# Patient Record
Sex: Female | Born: 1935 | Race: Black or African American | Hispanic: No | Marital: Married | Smoking: Never smoker
Health system: Southern US, Community
[De-identification: ages and names within clinical notes are randomized; demographics above are authoritative.]

## PROBLEM LIST (undated history)

## (undated) DIAGNOSIS — M1990 Unspecified osteoarthritis, unspecified site: Secondary | ICD-10-CM

## (undated) DIAGNOSIS — I509 Heart failure, unspecified: Secondary | ICD-10-CM

## (undated) DIAGNOSIS — E119 Type 2 diabetes mellitus without complications: Secondary | ICD-10-CM

## (undated) DIAGNOSIS — I10 Essential (primary) hypertension: Secondary | ICD-10-CM

## (undated) DIAGNOSIS — I2510 Atherosclerotic heart disease of native coronary artery without angina pectoris: Secondary | ICD-10-CM

## (undated) HISTORY — PX: CORONARY ARTERY BYPASS GRAFT: SHX141

---

## 2013-06-07 ENCOUNTER — Encounter (HOSPITAL_BASED_OUTPATIENT_CLINIC_OR_DEPARTMENT_OTHER): Payer: Self-pay | Admitting: Emergency Medicine

## 2013-06-07 ENCOUNTER — Emergency Department (HOSPITAL_BASED_OUTPATIENT_CLINIC_OR_DEPARTMENT_OTHER): Payer: Medicare Other

## 2013-06-07 ENCOUNTER — Emergency Department (HOSPITAL_BASED_OUTPATIENT_CLINIC_OR_DEPARTMENT_OTHER)
Admission: EM | Admit: 2013-06-07 | Discharge: 2013-06-07 | Disposition: A | Discharge status: Home / Self Care | Payer: Medicare Other | Attending: Emergency Medicine | Admitting: Emergency Medicine

## 2013-06-07 DIAGNOSIS — E669 Obesity, unspecified: Secondary | ICD-10-CM | POA: Insufficient documentation

## 2013-06-07 DIAGNOSIS — Z79899 Other long term (current) drug therapy: Secondary | ICD-10-CM | POA: Insufficient documentation

## 2013-06-07 DIAGNOSIS — R509 Fever, unspecified: Secondary | ICD-10-CM

## 2013-06-07 DIAGNOSIS — M109 Gout, unspecified: Secondary | ICD-10-CM | POA: Insufficient documentation

## 2013-06-07 DIAGNOSIS — E876 Hypokalemia: Secondary | ICD-10-CM

## 2013-06-07 DIAGNOSIS — Z794 Long term (current) use of insulin: Secondary | ICD-10-CM | POA: Insufficient documentation

## 2013-06-07 HISTORY — DX: Type 2 diabetes mellitus without complications: E11.9

## 2013-06-07 LAB — URINALYSIS, ROUTINE W REFLEX MICROSCOPIC
Bilirubin Urine: NEGATIVE
Glucose, UA: NEGATIVE mg/dL
HGB URINE DIPSTICK: NEGATIVE
Ketones, ur: NEGATIVE mg/dL
Leukocytes, UA: NEGATIVE
Nitrite: NEGATIVE
PH: 6 (ref 5.0–8.0)
Protein, ur: NEGATIVE mg/dL
Specific Gravity, Urine: 1.011 (ref 1.005–1.030)
UROBILINOGEN UA: 1 mg/dL (ref 0.0–1.0)

## 2013-06-07 LAB — CBC WITH DIFFERENTIAL/PLATELET
Basophils Absolute: 0 10*3/uL (ref 0.0–0.1)
Basophils Relative: 0 % (ref 0–1)
Eosinophils Absolute: 0 10*3/uL (ref 0.0–0.7)
Eosinophils Relative: 0 % (ref 0–5)
HEMATOCRIT: 39.8 % (ref 36.0–46.0)
HEMOGLOBIN: 13.9 g/dL (ref 12.0–15.0)
LYMPHS ABS: 1.6 10*3/uL (ref 0.7–4.0)
LYMPHS PCT: 14 % (ref 12–46)
MCH: 31.3 pg (ref 26.0–34.0)
MCHC: 34.9 g/dL (ref 30.0–36.0)
MCV: 89.6 fL (ref 78.0–100.0)
MONO ABS: 1.7 10*3/uL — AB (ref 0.1–1.0)
MONOS PCT: 14 % — AB (ref 3–12)
NEUTROS ABS: 8.7 10*3/uL — AB (ref 1.7–7.7)
NEUTROS PCT: 72 % (ref 43–77)
Platelets: 198 10*3/uL (ref 150–400)
RBC: 4.44 MIL/uL (ref 3.87–5.11)
RDW: 13.7 % (ref 11.5–15.5)
WBC: 12.1 10*3/uL — AB (ref 4.0–10.5)

## 2013-06-07 LAB — BASIC METABOLIC PANEL
BUN: 23 mg/dL (ref 6–23)
CHLORIDE: 86 meq/L — AB (ref 96–112)
CO2: 38 mEq/L — ABNORMAL HIGH (ref 19–32)
Calcium: 10.2 mg/dL (ref 8.4–10.5)
Creatinine, Ser: 1.3 mg/dL — ABNORMAL HIGH (ref 0.50–1.10)
GFR calc non Af Amer: 38 mL/min — ABNORMAL LOW (ref >90)
GFR, EST AFRICAN AMERICAN: 44 mL/min — AB (ref >90)
GLUCOSE: 173 mg/dL — AB (ref 70–99)
Potassium: 2.4 mEq/L — CL (ref 3.7–5.3)
Sodium: 138 mEq/L (ref 137–147)

## 2013-06-07 LAB — URIC ACID: Uric Acid, Serum: 9.2 mg/dL — ABNORMAL HIGH (ref 2.4–7.0)

## 2013-06-07 LAB — I-STAT CG4 LACTIC ACID, ED: LACTIC ACID, VENOUS: 1.78 mmol/L (ref 0.5–2.2)

## 2013-06-07 LAB — POTASSIUM: POTASSIUM: 2.9 meq/L — AB (ref 3.7–5.3)

## 2013-06-07 MED ORDER — VANCOMYCIN HCL IN DEXTROSE 1-5 GM/200ML-% IV SOLN
1000.0000 mg | Freq: Once | INTRAVENOUS | Status: AC
  Administered 2013-06-07: 1000 mg via INTRAVENOUS
  Filled 2013-06-07: qty 200

## 2013-06-07 MED ORDER — POTASSIUM CHLORIDE 10 MEQ/100ML IV SOLN
10.0000 meq | INTRAVENOUS | Status: AC
Start: 2013-06-07 — End: 2013-06-07
  Administered 2013-06-07 (×3): 10 meq via INTRAVENOUS
  Filled 2013-06-07 (×3): qty 100

## 2013-06-07 MED ORDER — POTASSIUM CHLORIDE CRYS ER 20 MEQ PO TBCR
40.0000 meq | EXTENDED_RELEASE_TABLET | Freq: Once | ORAL | Status: AC
  Administered 2013-06-07: 40 meq via ORAL
  Filled 2013-06-07: qty 2

## 2013-06-07 MED ORDER — IBUPROFEN 800 MG PO TABS
800.0000 mg | ORAL_TABLET | Freq: Once | ORAL | Status: AC
  Administered 2013-06-07: 800 mg via ORAL
  Filled 2013-06-07: qty 1

## 2013-06-07 MED ORDER — PIPERACILLIN-TAZOBACTAM 3.375 G IVPB
3.3750 g | Freq: Once | INTRAVENOUS | Status: AC
  Administered 2013-06-07: 3.375 g via INTRAVENOUS
  Filled 2013-06-07: qty 50

## 2013-06-07 MED ORDER — POTASSIUM CHLORIDE CRYS ER 20 MEQ PO TBCR: 40.0000 meq | EXTENDED_RELEASE_TABLET | Freq: Two times a day (BID) | ORAL | Status: DC

## 2013-06-07 MED ORDER — SODIUM CHLORIDE 0.9 % IV SOLN
INTRAVENOUS | Status: DC
  Administered 2013-06-07: 12:00:00 via INTRAVENOUS

## 2013-06-07 MED ORDER — OXYCODONE-ACETAMINOPHEN 5-325 MG PO TABS
2.0000 | ORAL_TABLET | Freq: Once | ORAL | Status: AC
  Administered 2013-06-07: 2 via ORAL
  Filled 2013-06-07: qty 2

## 2013-06-07 NOTE — ED Notes (Signed)
Pt c/o "burning" to iv site and right arm. No swelling or redness noted. Iv slowed to 50cc/hr, pt reports relief of burning sensation.

## 2013-06-07 NOTE — ED Provider Notes (Signed)
Patient was seen by Dr. Rosalia Hammers. She was seen for a gouty arthritis flare were hand. She was noted to be hypokalemic. She was awaiting a repeat potassium. The plan was if the potassium is 2.9 or higher, the patient was able to be discharged with outpatient followup. The potassium was indeed improved to 2.9. She was discharged with prescriptions that were printed per Dr. Rosalia Hammers.  Rolan Bucco, MD 06/07/13 2132

## 2013-06-07 NOTE — ED Provider Notes (Signed)
CSN: [id number]     Arrival date & time 06/07/13  1021 History   First MD Initiated Contact with Patient 06/07/13 1035     Chief Complaint  Patient presents with  . Hand Pain  dementia Level 5 caveat   (Consider location/radiation/quality/duration/timing/severity/associated sxs/prior Treatment) HPI Dementia- history obtained from patient and daughter 80 y.o. Female with history of gout complaining of pain and swelling in her left hand c.w. Prior gout flares.  States last occurred about a month ago in same place.  She has had multiple episode of gout treated by her md in feet, knees, and hands. Neither she or her daughter are able to tell me what she has been treated with for her gout.  Her doctor is not local.  She is her staying with her daughter while her husband is hospitalized as she id unable to stay alone due to blindness and dementia.  No past medical history on file. No past surgical history on file. No family history on file. History  Substance Use Topics  . Smoking status: Not on file  . Smokeless tobacco: Not on file  . Alcohol Use: Not on file   OB History   No data available     Review of Systems  All other systems reviewed and are negative.     Allergies  Review of patient's allergies indicates no known allergies.  Home Medications   Prior to Admission medications   Medication Sig Start Date End Date Taking? Authorizing Provider  ALPRAZolam Prudy Feeler(XANAX) 0.25 MG tablet Take 0.25 mg by mouth at bedtime as needed for anxiety.   Yes Historical Provider, MD  amLODipine (NORVASC) 5 MG tablet Take 5 mg by mouth daily.   Yes Historical Provider, MD  atorvastatin (LIPITOR) 20 MG tablet Take 20 mg by mouth daily.   Yes Historical Provider, MD  carvedilol (COREG) 3.125 MG tablet Take 3.125 mg by mouth 2 (two) times daily with a meal.   Yes Historical Provider, MD  citalopram (CELEXA) 20 MG tablet Take 20 mg by mouth daily.   Yes Historical Provider, MD  donepezil (ARICEPT)  10 MG tablet Take 10 mg by mouth at bedtime.   Yes Historical Provider, MD  esomeprazole (NEXIUM) 40 MG capsule Take 40 mg by mouth daily at 12 noon.   Yes Historical Provider, MD  furosemide (LASIX) 80 MG tablet Take 80 mg by mouth 2 (two) times daily.   Yes Historical Provider, MD  gabapentin (NEURONTIN) 300 MG capsule Take 300 mg by mouth 2 (two) times daily.   Yes Historical Provider, MD  insulin NPH-regular Human (NOVOLIN 70/30) (70-30) 100 UNIT/ML injection Inject into the skin. 70 units q am, 40 units qpm   Yes Historical Provider, MD  isosorbide mononitrate (IMDUR) 30 MG 24 hr tablet Take 60 mg by mouth daily.   Yes Historical Provider, MD  metolazone (ZAROXOLYN) 2.5 MG tablet Take 2.5 mg by mouth daily.   Yes Historical Provider, MD  potassium chloride SA (K-DUR,KLOR-CON) 20 MEQ tablet Take 20 mEq by mouth 2 (two) times daily.   Yes Historical Provider, MD  sucralfate (CARAFATE) 1 G tablet Take 1 g by mouth 4 (four) times daily -  with meals and at bedtime.   Yes Historical Provider, MD   BP 154/91  Pulse 58  Temp(Src) 100.4 F (38 C) (Oral)  Resp 18  SpO2 96% Physical Exam  Nursing note and vitals reviewed. Constitutional: She appears well-developed and well-nourished.  obese  HENT:  Head: Normocephalic.  Right Ear: External ear normal.  Left Ear: External ear normal.  Nose: Nose normal.  Mouth/Throat: Oropharynx is clear and moist.  Eyes: Conjunctivae and EOM are normal. Pupils are equal, round, and reactive to light.  Neck: Normal range of motion. Neck supple.  Cardiovascular: Normal rate, regular rhythm, normal heart sounds and intact distal pulses.   Pulmonary/Chest: Effort normal and breath sounds normal.  Abdominal: Soft. Bowel sounds are normal.  Musculoskeletal:       Hands:   ED Course  Procedures (including critical care time) Labs Review Labs Reviewed  CBC WITH DIFFERENTIAL  BASIC METABOLIC PANEL  URIC ACID    Imaging Review No results found.   EKG  Interpretation None      MDM   Final diagnoses:  Gouty arthritis  Fever  Hypokalemia    Patient presents with complaints of gouty arthritis but incidental finding of fever and hypokalemia.  1- gouty arthritis 2- fever- unclear source- blood cultures obtained and patient covered empirically for unknown source although cannot rule out hand and wrist as source.  3-hypokalemia with ekg with nonspecific intraventricular block and nsst change of unknown chronicity which are consistent with hypokalemia.  Repletion ensuing. 4- abnormal ekg   Discussed with Dr. Waymon Amato.  We will recheck potassium and if potassium improved, we will discharge patient to follow up with pmd next week.   Discussed with Dr. Fredderick Phenix and plan discussed.   Hilario Quarry, MD 06/09/13 [phone]

## 2013-06-07 NOTE — Discharge Instructions (Signed)
Gout Gout is when your joints become red, sore, and swell (inflammed). This is caused by the buildup of uric acid crystals in the joints. Uric acid is a chemical that is normally in the blood. If the level of uric acid gets too high in the blood, these crystals form in your joints and tissues. Over time, these crystals can form into masses near the joints and tissues. These masses can destroy bone and cause the bone to look misshapen (deformed). HOME CARE   Do not take aspirin for pain.  Only take medicine as told by your doctor.  Rest the joint as much as you can. When in bed, keep sheets and blankets off painful areas.  Keep the sore joints raised (elevated).  Put warm or cold packs on painful joints. Use of warm or cold packs depends on which works best for you.  Use crutches if the painful joint is in your leg.  Drink enough fluids to keep your pee (urine) clear or pale yellow. Limit alcohol, sugary drinks, and drinks with fructose in them.  Follow your diet instructions. Pay careful attention to how much protein you eat. Include fruits, vegetables, whole grains, and fat-free or low-fat milk products in your daily diet. Talk to your doctor or dietician about the use of coffee, vitamin C, and cherries. These may help lower uric acid levels.  Keep a healthy body weight. GET HELP RIGHT AWAY IF:   You have watery poop (diarrhea), throw up (vomit), or have any side effects from medicines.  You do not feel better in 24 hours, or you are getting worse.  Your joint becomes suddenly more tender, and you have chills or a fever. MAKE SURE YOU:   Understand these instructions.  Will watch your condition.  Will get help right away if you are not doing well or get worse. Document Released: 10/13/2007 Document Revised: 04/30/2012 Document Reviewed: 04/13/2009 [facility] Patient Information 2014 [place], [place]. Hypokalemia Hypokalemia means that the amount of potassium in the blood is lower  than normal.Potassium is a chemical, called an electrolyte, that helps regulate the amount of fluid in the body. It also stimulates muscle contraction and helps nerves function properly.Most of the body's potassium is inside of cells, and only a very small amount is in the blood. Because the amount in the blood is so small, minor changes can be life-threatening. CAUSES  Antibiotics.  Diarrhea or vomiting.  Using laxatives too much, which can cause diarrhea.  Chronic kidney disease.  Water pills (diuretics).  Eating disorders (bulimia).  Low magnesium level.  Sweating a lot. SIGNS AND SYMPTOMS  Weakness.  Constipation.  Fatigue.  Muscle cramps.  Mental confusion.  Skipped heartbeats or irregular heartbeat (palpitations).  Tingling or numbness. DIAGNOSIS  Your health care provider can diagnose hypokalemia with blood tests. In addition to checking your potassium level, your health care provider may also check other lab tests. TREATMENT Hypokalemia can be treated with potassium supplements taken by mouth or adjustments in your current medicines. If your potassium level is very low, you may need to get potassium through a vein (IV) and be monitored in the hospital. A diet high in potassium is also helpful. Foods high in potassium are:  Nuts, such as peanuts and pistachios.  Seeds, such as sunflower seeds and pumpkin seeds.  Peas, lentils, and lima beans.  Whole grain and bran cereals and breads.  Fresh fruit and vegetables, such as apricots, avocado, bananas, cantaloupe, kiwi, oranges, tomatoes, asparagus, and potatoes.  Orange  and tomato juices.  Red meats.  Fruit yogurt. HOME CARE INSTRUCTIONS  Take all medicines as prescribed by your health care provider.  Maintain a healthy diet by including nutritious food, such as fruits, vegetables, nuts, whole grains, and lean meats.  If you are taking a laxative, be sure to follow the directions on the label. SEEK  MEDICAL CARE IF:  Your weakness gets worse.  You feel your heart pounding or racing.  You are vomiting or having diarrhea.  You are diabetic and having trouble keeping your blood glucose in the normal range. SEEK IMMEDIATE MEDICAL CARE IF:  You have chest pain, shortness of breath, or dizziness.  You are vomiting or having diarrhea for more than 2 days.  You faint. MAKE SURE YOU:   Understand these instructions.  Will watch your condition.  Will get help right away if you are not doing well or get worse. Document Released: 01/03/2005 Document Revised: 10/24/2012 Document Reviewed: 07/06/2012 [facility] Patient Information 2014 [place], [place].

## 2013-06-07 NOTE — ED Notes (Signed)
Pt to room 6 in w/c, able to stand and walk to bed using cane. Per daughter, pt started having left hand swelling, redness, pain and stiffness overnight, worsening. Pt states this is her usual gout pain, "I know what it is, I've had it so many times before.Marland KitchenMarland Kitchen"

## 2013-06-13 LAB — CULTURE, BLOOD (ROUTINE X 2)
CULTURE: NO GROWTH
Culture: NO GROWTH

## 2013-08-18 ENCOUNTER — Emergency Department (HOSPITAL_BASED_OUTPATIENT_CLINIC_OR_DEPARTMENT_OTHER): Payer: Medicare Other

## 2013-08-18 ENCOUNTER — Encounter (HOSPITAL_BASED_OUTPATIENT_CLINIC_OR_DEPARTMENT_OTHER): Payer: Self-pay | Admitting: Emergency Medicine

## 2013-08-18 ENCOUNTER — Inpatient Hospital Stay (HOSPITAL_BASED_OUTPATIENT_CLINIC_OR_DEPARTMENT_OTHER)
Admission: EM | Admit: 2013-08-18 | Discharge: 2013-08-22 | DRG: 683 | Disposition: A | Discharge status: Home / Self Care | Payer: Medicare Other | Attending: Internal Medicine | Admitting: Internal Medicine

## 2013-08-18 DIAGNOSIS — E785 Hyperlipidemia, unspecified: Secondary | ICD-10-CM | POA: Diagnosis present

## 2013-08-18 DIAGNOSIS — I25119 Atherosclerotic heart disease of native coronary artery with unspecified angina pectoris: Secondary | ICD-10-CM

## 2013-08-18 DIAGNOSIS — I2510 Atherosclerotic heart disease of native coronary artery without angina pectoris: Secondary | ICD-10-CM | POA: Diagnosis present

## 2013-08-18 DIAGNOSIS — H409 Unspecified glaucoma: Secondary | ICD-10-CM | POA: Diagnosis present

## 2013-08-18 DIAGNOSIS — I4729 Other ventricular tachycardia: Secondary | ICD-10-CM | POA: Diagnosis not present

## 2013-08-18 DIAGNOSIS — N183 Chronic kidney disease, stage 3 unspecified: Secondary | ICD-10-CM | POA: Diagnosis present

## 2013-08-18 DIAGNOSIS — D649 Anemia, unspecified: Secondary | ICD-10-CM | POA: Diagnosis present

## 2013-08-18 DIAGNOSIS — M109 Gout, unspecified: Secondary | ICD-10-CM | POA: Diagnosis present

## 2013-08-18 DIAGNOSIS — R9431 Abnormal electrocardiogram [ECG] [EKG]: Secondary | ICD-10-CM | POA: Diagnosis present

## 2013-08-18 DIAGNOSIS — F0390 Unspecified dementia without behavioral disturbance: Secondary | ICD-10-CM | POA: Diagnosis present

## 2013-08-18 DIAGNOSIS — E860 Dehydration: Secondary | ICD-10-CM | POA: Diagnosis present

## 2013-08-18 DIAGNOSIS — E876 Hypokalemia: Secondary | ICD-10-CM | POA: Diagnosis present

## 2013-08-18 DIAGNOSIS — N19 Unspecified kidney failure: Secondary | ICD-10-CM

## 2013-08-18 DIAGNOSIS — I10 Essential (primary) hypertension: Secondary | ICD-10-CM | POA: Diagnosis present

## 2013-08-18 DIAGNOSIS — R079 Chest pain, unspecified: Secondary | ICD-10-CM | POA: Diagnosis not present

## 2013-08-18 DIAGNOSIS — R404 Transient alteration of awareness: Secondary | ICD-10-CM | POA: Diagnosis present

## 2013-08-18 DIAGNOSIS — Z794 Long term (current) use of insulin: Secondary | ICD-10-CM | POA: Diagnosis not present

## 2013-08-18 DIAGNOSIS — I959 Hypotension, unspecified: Secondary | ICD-10-CM | POA: Diagnosis present

## 2013-08-18 DIAGNOSIS — I472 Ventricular tachycardia, unspecified: Secondary | ICD-10-CM | POA: Diagnosis not present

## 2013-08-18 DIAGNOSIS — N179 Acute kidney failure, unspecified: Secondary | ICD-10-CM | POA: Diagnosis present

## 2013-08-18 DIAGNOSIS — N170 Acute kidney failure with tubular necrosis: Secondary | ICD-10-CM | POA: Diagnosis not present

## 2013-08-18 DIAGNOSIS — I509 Heart failure, unspecified: Secondary | ICD-10-CM | POA: Diagnosis present

## 2013-08-18 DIAGNOSIS — Z951 Presence of aortocoronary bypass graft: Secondary | ICD-10-CM | POA: Diagnosis not present

## 2013-08-18 DIAGNOSIS — E1129 Type 2 diabetes mellitus with other diabetic kidney complication: Secondary | ICD-10-CM | POA: Diagnosis present

## 2013-08-18 DIAGNOSIS — I129 Hypertensive chronic kidney disease with stage 1 through stage 4 chronic kidney disease, or unspecified chronic kidney disease: Secondary | ICD-10-CM | POA: Diagnosis present

## 2013-08-18 DIAGNOSIS — G4733 Obstructive sleep apnea (adult) (pediatric): Secondary | ICD-10-CM | POA: Diagnosis present

## 2013-08-18 DIAGNOSIS — H547 Unspecified visual loss: Secondary | ICD-10-CM | POA: Diagnosis present

## 2013-08-18 DIAGNOSIS — N058 Unspecified nephritic syndrome with other morphologic changes: Secondary | ICD-10-CM | POA: Diagnosis present

## 2013-08-18 DIAGNOSIS — I5032 Chronic diastolic (congestive) heart failure: Secondary | ICD-10-CM | POA: Diagnosis present

## 2013-08-18 DIAGNOSIS — E118 Type 2 diabetes mellitus with unspecified complications: Secondary | ICD-10-CM

## 2013-08-18 DIAGNOSIS — H543 Unqualified visual loss, both eyes: Secondary | ICD-10-CM | POA: Diagnosis present

## 2013-08-18 DIAGNOSIS — M129 Arthropathy, unspecified: Secondary | ICD-10-CM | POA: Diagnosis present

## 2013-08-18 DIAGNOSIS — R55 Syncope and collapse: Secondary | ICD-10-CM

## 2013-08-18 DIAGNOSIS — N189 Chronic kidney disease, unspecified: Secondary | ICD-10-CM

## 2013-08-18 DIAGNOSIS — Z9989 Dependence on other enabling machines and devices: Secondary | ICD-10-CM

## 2013-08-18 DIAGNOSIS — E119 Type 2 diabetes mellitus without complications: Secondary | ICD-10-CM | POA: Diagnosis present

## 2013-08-18 DIAGNOSIS — R42 Dizziness and giddiness: Secondary | ICD-10-CM | POA: Diagnosis present

## 2013-08-18 DIAGNOSIS — W19XXXA Unspecified fall, initial encounter: Secondary | ICD-10-CM

## 2013-08-18 HISTORY — DX: Essential (primary) hypertension: I10

## 2013-08-18 HISTORY — DX: Unspecified osteoarthritis, unspecified site: M19.90

## 2013-08-18 HISTORY — DX: Atherosclerotic heart disease of native coronary artery without angina pectoris: I25.10

## 2013-08-18 LAB — CBC WITH DIFFERENTIAL/PLATELET
Basophils Absolute: 0 10*3/uL (ref 0.0–0.1)
Basophils Relative: 0 % (ref 0–1)
EOS ABS: 0.1 10*3/uL (ref 0.0–0.7)
Eosinophils Relative: 1 % (ref 0–5)
HCT: 35.7 % — ABNORMAL LOW (ref 36.0–46.0)
HEMOGLOBIN: 12.1 g/dL (ref 12.0–15.0)
LYMPHS ABS: 2.2 10*3/uL (ref 0.7–4.0)
Lymphocytes Relative: 30 % (ref 12–46)
MCH: 30.2 pg (ref 26.0–34.0)
MCHC: 33.9 g/dL (ref 30.0–36.0)
MCV: 89 fL (ref 78.0–100.0)
Monocytes Absolute: 0.9 10*3/uL (ref 0.1–1.0)
Monocytes Relative: 12 % (ref 3–12)
NEUTROS ABS: 4.2 10*3/uL (ref 1.7–7.7)
NEUTROS PCT: 57 % (ref 43–77)
Platelets: ADEQUATE 10*3/uL (ref 150–400)
RBC: 4.01 MIL/uL (ref 3.87–5.11)
RDW: 13.4 % (ref 11.5–15.5)
WBC: 7.3 10*3/uL (ref 4.0–10.5)

## 2013-08-18 LAB — COMPREHENSIVE METABOLIC PANEL
ALT: 6 U/L (ref 0–35)
ANION GAP: 24 — AB (ref 5–15)
AST: 13 U/L (ref 0–37)
Albumin: 3.2 g/dL — ABNORMAL LOW (ref 3.5–5.2)
Alkaline Phosphatase: 66 U/L (ref 39–117)
BUN: 76 mg/dL — AB (ref 6–23)
CO2: 24 meq/L (ref 19–32)
Calcium: 9.1 mg/dL (ref 8.4–10.5)
Chloride: 89 mEq/L — ABNORMAL LOW (ref 96–112)
Creatinine, Ser: 5.7 mg/dL — ABNORMAL HIGH (ref 0.50–1.10)
GFR calc Af Amer: 7 mL/min — ABNORMAL LOW (ref >90)
GFR calc non Af Amer: 6 mL/min — ABNORMAL LOW (ref >90)
Glucose, Bld: 211 mg/dL — ABNORMAL HIGH (ref 70–99)
Potassium: 3.5 mEq/L — ABNORMAL LOW (ref 3.7–5.3)
Sodium: 137 mEq/L (ref 137–147)
Total Protein: 6.9 g/dL (ref 6.0–8.3)

## 2013-08-18 LAB — GLUCOSE, CAPILLARY
Glucose-Capillary: 78 mg/dL (ref 70–99)
Glucose-Capillary: 153 mg/dL — ABNORMAL HIGH (ref 70–99)

## 2013-08-18 LAB — APTT: aPTT: 29 seconds (ref 24–37)

## 2013-08-18 LAB — I-STAT CG4 LACTIC ACID, ED: Lactic Acid, Venous: 1.89 mmol/L (ref 0.5–2.2)

## 2013-08-18 LAB — MAGNESIUM: Magnesium: 2.1 mg/dL (ref 1.5–2.5)

## 2013-08-18 LAB — PROTIME-INR
INR: 1.03 (ref 0.00–1.49)
PROTHROMBIN TIME: 13.5 s (ref 11.6–15.2)

## 2013-08-18 LAB — TROPONIN I

## 2013-08-18 LAB — CBG MONITORING, ED: GLUCOSE-CAPILLARY: 207 mg/dL — AB (ref 70–99)

## 2013-08-18 MED ORDER — ATORVASTATIN CALCIUM 20 MG PO TABS
20.0000 mg | ORAL_TABLET | Freq: Every day | ORAL | Status: DC
  Administered 2013-08-19 – 2013-08-22 (×4): 20 mg via ORAL
  Filled 2013-08-18 (×4): qty 1

## 2013-08-18 MED ORDER — CITALOPRAM HYDROBROMIDE 20 MG PO TABS
20.0000 mg | ORAL_TABLET | Freq: Every day | ORAL | Status: DC
  Administered 2013-08-19 – 2013-08-22 (×4): 20 mg via ORAL
  Filled 2013-08-18 (×4): qty 1

## 2013-08-18 MED ORDER — INSULIN ASPART 100 UNIT/ML ~~LOC~~ SOLN
0.0000 [IU] | Freq: Three times a day (TID) | SUBCUTANEOUS | Status: DC
Start: 2013-08-19 — End: 2013-08-18

## 2013-08-18 MED ORDER — ALPRAZOLAM 0.25 MG PO TABS
0.2500 mg | ORAL_TABLET | Freq: Every evening | ORAL | Status: DC | PRN
  Administered 2013-08-20: 0.25 mg via ORAL
  Filled 2013-08-18 (×2): qty 1

## 2013-08-18 MED ORDER — SODIUM CHLORIDE 0.9 % IV BOLUS (SEPSIS): 1000.0000 mL | INTRAVENOUS | Status: AC

## 2013-08-18 MED ORDER — INSULIN ASPART 100 UNIT/ML ~~LOC~~ SOLN: 0.0000 [IU] | Freq: Three times a day (TID) | SUBCUTANEOUS | Status: DC

## 2013-08-18 MED ORDER — DONEPEZIL HCL 10 MG PO TABS
10.0000 mg | ORAL_TABLET | Freq: Every day | ORAL | Status: DC
  Administered 2013-08-18 – 2013-08-21 (×4): 10 mg via ORAL
  Filled 2013-08-18 (×5): qty 1

## 2013-08-18 MED ORDER — PANTOPRAZOLE SODIUM 40 MG PO TBEC
40.0000 mg | DELAYED_RELEASE_TABLET | Freq: Every day | ORAL | Status: DC
  Administered 2013-08-19 – 2013-08-22 (×4): 40 mg via ORAL
  Filled 2013-08-18 (×3): qty 1

## 2013-08-18 MED ORDER — HEPARIN SODIUM (PORCINE) 5000 UNIT/ML IJ SOLN
5000.0000 [IU] | Freq: Three times a day (TID) | INTRAMUSCULAR | Status: DC
  Administered 2013-08-18 – 2013-08-22 (×10): 5000 [IU] via SUBCUTANEOUS
  Filled 2013-08-18 (×16): qty 1

## 2013-08-18 MED ORDER — POTASSIUM CHLORIDE CRYS ER 20 MEQ PO TBCR
20.0000 meq | EXTENDED_RELEASE_TABLET | Freq: Once | ORAL | Status: AC
  Administered 2013-08-18: 20 meq via ORAL
  Filled 2013-08-18: qty 1

## 2013-08-18 MED ORDER — SODIUM CHLORIDE 0.9 % IJ SOLN
3.0000 mL | Freq: Two times a day (BID) | INTRAMUSCULAR | Status: DC
  Administered 2013-08-20 – 2013-08-21 (×3): 3 mL via INTRAVENOUS

## 2013-08-18 MED ORDER — SODIUM CHLORIDE 0.9 % IV SOLN
INTRAVENOUS | Status: DC
  Administered 2013-08-18 – 2013-08-19 (×2): via INTRAVENOUS

## 2013-08-18 MED ORDER — SUCRALFATE 1 G PO TABS
1.0000 g | ORAL_TABLET | Freq: Three times a day (TID) | ORAL | Status: DC
  Administered 2013-08-18 – 2013-08-22 (×15): 1 g via ORAL
  Filled 2013-08-18 (×19): qty 1

## 2013-08-18 NOTE — ED Notes (Signed)
Patient arrived by POV with family who report acute dizziness since awakening this am and found to be hypotensive on arrival. Patient also fell this am out of chair while eating at restaurant. Small hematoma to forehead, family denies loc. Patient alert and answers questions appropriately. Patient daughter reports that patient been experiencing some memory problems and incontinence issues. Hx of diabetes, patient denies chest pain but reports some associated shortness of breath

## 2013-08-18 NOTE — H&P (Addendum)
History and Physical    Christy Swanson RUE:[id number] DOB: [birth year] DOA: 08/18/2013  Referring physician: Dr. Romeo Apple PCP: No primary provider on file.  Specialists: none   Chief Complaint: Weakness and dizziness  HPI: Christy Swanson is a 80 y.o. female a relatively new in our system but tells me he has a history of diabetes mellitus, hypertension, coronary artery disease status post CABG about 10 years ago, presents to the emergency room with a chief complaint of dizziness,weakness and lightheadedness for the past 12-24 hours. The history is provided by the patient. She was feeling fine yesterday morning, however last night she started feeling a little bit weak, and has felt progressively worse today and noticed that the dizziness is significantly worse when she stands up, to the point were she had a fall this morning. She denies any syncopal episodes but apparently hit her head. She denies any fever or chills. She denies any shortness or breath or chest congestion, she has a mild cough for 2 days. She denies any chest pain. She has no abdominal pain, she has mild nausea but no vomiting or diarrhea. She denies any dysuria or burning with urination, however noticed that she has been incontinent on one occasion. In the emergency room, patient was found to be in acute renal failure with a creatinine of 5.7 from a baseline of 1.3 about 3 months ago and on her emergency room visit. Given his to a fall she underwent a CT scan of the head without contrast which showed no acute pathology. She had a chest x-ray which showed no evidence of pneumonia. A urinalysis is pending.  Review of Systems: As per history of present illness, otherwise negative  Past Medical History  Diagnosis Date  . Diabetes mellitus without complication   . Hypertension   . Coronary artery disease   . Arthritis    Past Surgical History  Procedure Laterality Date  . Coronary artery bypass graft     Social History:  reports  that she has never smoked. She does not have any smokeless tobacco history on file. She reports that she does not drink alcohol. Her drug history is not on file.  No Known Allergies  Family history noncontributory  Prior to Admission medications   Medication Sig Start Date End Date Taking? Authorizing Provider  ALPRAZolam Prudy Feeler) 0.25 MG tablet Take 0.25 mg by mouth at bedtime as needed for anxiety.    Historical Provider, MD  amLODipine (NORVASC) 5 MG tablet Take 5 mg by mouth daily.    Historical Provider, MD  atorvastatin (LIPITOR) 20 MG tablet Take 20 mg by mouth daily.    Historical Provider, MD  carvedilol (COREG) 3.125 MG tablet Take 3.125 mg by mouth 2 (two) times daily with a meal.    Historical Provider, MD  citalopram (CELEXA) 20 MG tablet Take 20 mg by mouth daily.    Historical Provider, MD  donepezil (ARICEPT) 10 MG tablet Take 10 mg by mouth at bedtime.    Historical Provider, MD  esomeprazole (NEXIUM) 40 MG capsule Take 40 mg by mouth daily at 12 noon.    Historical Provider, MD  furosemide (LASIX) 80 MG tablet Take 80 mg by mouth 2 (two) times daily.    Historical Provider, MD  gabapentin (NEURONTIN) 300 MG capsule Take 300 mg by mouth 2 (two) times daily.    Historical Provider, MD  insulin NPH-regular Human (NOVOLIN 70/30) (70-30) 100 UNIT/ML injection Inject into the skin. 70 units q am, 40 units qpm  Historical Provider, MD  isosorbide mononitrate (IMDUR) 30 MG 24 hr tablet Take 60 mg by mouth daily.    Historical Provider, MD  metolazone (ZAROXOLYN) 2.5 MG tablet Take 2.5 mg by mouth daily.    Historical Provider, MD  potassium chloride SA (K-DUR,KLOR-CON) 20 MEQ tablet Take 20 mEq by mouth 2 (two) times daily.    Historical Provider, MD  potassium chloride SA (K-DUR,KLOR-CON) 20 MEQ tablet Take 2 tablets (40 mEq total) by mouth 2 (two) times daily. 06/07/13   Hilario Quarry, MD  sucralfate (CARAFATE) 1 G tablet Take 1 g by mouth 4 (four) times daily -  with meals and at  bedtime.    Historical Provider, MD   Physical Exam: Filed Vitals:   08/18/13 1530 08/18/13 1600 08/18/13 1721 08/18/13 1723  BP: 104/45 107/47 67/31 74/36   Pulse: 53 48  52  Temp:    98 F (36.7 C)  TempSrc:    Oral  Resp: 19 18  18   Height:    5\' 4"  (1.626 m)  Weight:    99.1 kg (218 lb 7.6 oz)  SpO2: 97% 95%  94%     General:  No apparent distress   Eyes: prosthetic L eye, opaque right eye   Neck: no JVD  Cardiovascular: regular rate without MRG; 2+ peripheral pulses  Respiratory: Good air movement bilaterally, no wheezing, mild rhonchi throughout   Abdomen: soft, non tender to palpation, positive bowel sounds, no guarding, no rebound  Skin: no rashes  Musculoskeletal: no peripheral edema  Neurologic: Nonfocal findings, strength 5 out of 5 all 4 extremities  Labs on Admission:  Basic Metabolic Panel:  Recent Labs Lab 08/18/13 1244 08/18/13 1322  NA 137  --   K 3.5*  --   CL 89*  --   CO2 24  --   GLUCOSE 211*  --   BUN 76*  --   CREATININE 5.70*  --   CALCIUM 9.1  --   MG  --  2.1   Liver Function Tests:  Recent Labs Lab 08/18/13 1244  AST 13  ALT 6  ALKPHOS 66  BILITOT <0.2*  PROT 6.9  ALBUMIN 3.2*   CBC:  Recent Labs Lab 08/18/13 1244  WBC 7.3  NEUTROABS 4.2  HGB 12.1  HCT 35.7*  MCV 89.0  PLT PLATELET CLUMPS NOTED ON SMEAR, COUNT APPEARS ADEQUATE   Cardiac Enzymes:  Recent Labs Lab 08/18/13 1322  TROPONINI <0.30    BNP (last 3 results) No results found for this basename: PROBNP,  in the last 8760 hours CBG:  Recent Labs Lab 08/18/13 1118 08/18/13 1720  GLUCAP 207* 78    Radiological Exams on Admission: Dg Chest 2 View  08/18/2013   CLINICAL DATA:  Dizziness, hypotension, fall today  EXAM: CHEST  2 VIEW  COMPARISON:  06/07/2013  FINDINGS: Linear right lower lobe and left mid lung zone scarring are stable. Evidence of CABG noted with stable rightward position of the inferior most 2 sternal wires as compared to the  superior 5 sternal wires. Heart size is mildly enlarged without evidence for edema. No pleural effusion. No acute osseous finding.  IMPRESSION: Mild cardiomegaly with bibasilar scarring but no acute finding.   Electronically Signed   By: Christiana Pellant M.D.   On: 08/18/2013 12:57   Ct Head Wo Contrast  08/18/2013   ADDENDUM REPORT: 08/18/2013 14:14   Electronically Signed   By: Maryclare Bean M.D.   On: 08/18/2013 14:14   08/18/2013  CLINICAL DATA:  Dizziness  EXAM: CT HEAD WITHOUT CONTRAST  TECHNIQUE: Contiguous axial images were obtained from the base of the skull through the vertex without intravenous contrast.  COMPARISON:  None.  FINDINGS: Global atrophy. Chronic ischemic changes in the periventricular white matter. No mass effect, midline shift, or acute hemorrhage. Inflammatory changes in the sphenoid sinuses. Mastoid air cells are clear.  IMPRESSION: No acute intracranial pathology.  Electronically Signed: By: Maryclare BeanArt  Hoss M.D. On: 08/18/2013 13:43    EKG: Independently reviewed. Sinus bradycardia.   Assessment/Plan Principal Problem:   Renal failure Active Problems:   CAD (coronary artery disease)   S/P CABG (coronary artery bypass graft)   Acute renal failure   Dehydration   Hypotension, unspecified   Diabetes mellitus   Glaucoma   Blindness   Essential hypertension, benign   Other and unspecified hyperlipidemia     Hypotension - with symptomatic dizziness and symptomatic orthostasis, with renal failure and she clinically seems to be very dehydrated. She is on furosemide and metolazone and she tells me that she took these medications this morning as well as all her other medications for blood pressure. On arrival to 6E she is hypotensive to 70s systolic, asymptomatic while lying flat.  - Start bolus IV fluids - No apparent infectious source, urinalysis pending at this time, she is afebrile and has no leukocytosis, no need for antibiotics unless urinalysis is revealing. Lactic acid is  normal.   Acute renal failure - I not that clear about baseline creatinine, however 3 months ago her creatinine was 1.3. This is likely due to dehydration, repeat basic metabolic panel in the morning after IV fluids, consider nephrology consult if it fails to improve.  Hypertension - hold home antihypertensives  Diabetes mellitus - obtain hemoglobin A1c to assess control. Her blood sugar presentation a 78, will hold home insulin of 70/30 given renal failure, allow a carb diet and use moderate sliding scale insulin. Start long-acting insulin if needed.  Coronary artery disease - she had a CABG about 10 years ago - Stable currently, no chest pain - Resume the atorvastatin, hold carvedilol, hold Lasix, hold Imdur, hold metolazone  Hyperlipidemia - on atorvastatin  Hypokalemia - she is on significant potassium supplementation at home probably because she is on Lasix, and even with renal failure she is hypokalemic on admission, previous potassium levels in May of 2.4, will replete 20 mEq tonight and repeat BMP in am.    Diet: Carb modified Fluids: NS 125 cc/h after bolus DVT Prophylaxis: heparin  Code Status: Full  Family Communication: none  Disposition Plan: inpatient  Time spent: 5870  This note has been created with Education officer, environmentalDragon speech recognition software and smart phrase technology. Any transcriptional errors are unintentional.   Christy Crandle M. Elvera LennoxGherghe, MD [facility] Pager [phone]  If 7PM-7AM, please contact night-coverage [email or web address] Password [email or web address] 08/18/2013, 5:40 PM

## 2013-08-18 NOTE — Progress Notes (Signed)
[facility] ([facility] AppleHarrison[facility] of Ramond DialDorothy Buschman (MRN [id number]) Patient came to ED complaining of dizziness and episode of near syncope; was found to be hypotensive, mildly dehydrated and with blood work  Demonstrating acute on chronic renal failure (Cr 1.3 in May 2015 and is 5.70 today 08/18/13). [facility]; after initial work up and treatment might required renal service consultation. Of note, CT head neg, K3.5 and after initial IVF's vital signs stable (BP just soft into low 100's)  Vassie LollMadera, Dmani Mizer [phone]

## 2013-08-18 NOTE — ED Notes (Signed)
Patient transported to X-ray 

## 2013-08-18 NOTE — ED Provider Notes (Signed)
CSN: [id number]     Arrival date & time 08/18/13  1059 History   First MD Initiated Contact with Patient 08/18/13 1221     Chief Complaint  Patient presents with  . Dizziness     (Consider location/radiation/quality/duration/timing/severity/associated sxs/prior Treatment) Patient is a 80 y.o. female presenting with dizziness and fall. The history is provided by the patient and a caregiver.  Dizziness Quality:  Room spinning Severity:  Mild Onset quality:  Gradual Duration:  1 day Timing:  Constant Progression:  Worsening Chronicity:  New Context comment:  With standing Relieved by:  Nothing Worsened by:  Nothing tried Ineffective treatments:  None tried Associated symptoms: no chest pain, no diarrhea, no headaches, no nausea, no shortness of breath and no vomiting   Fall This is a new problem. The current episode started 3 to 5 hours ago. Episode frequency: once. The problem has been resolved. Pertinent negatives include no chest pain, no abdominal pain, no headaches and no shortness of breath. Nothing aggravates the symptoms. Nothing relieves the symptoms. She has tried nothing for the symptoms. The treatment provided no relief.    Past Medical History  Diagnosis Date  . Diabetes mellitus without complication   . Hypertension   . Coronary artery disease   . Arthritis    Past Surgical History  Procedure Laterality Date  . Coronary artery bypass graft     No family history on file. History  Substance Use Topics  . Smoking status: Never Smoker   . Smokeless tobacco: Not on file  . Alcohol Use: Not on file   OB History   Grav Para Term Preterm Abortions TAB SAB Ect Mult Living                 Review of Systems  Constitutional: Negative for fever and fatigue.  HENT: Negative for congestion and drooling.   Eyes: Negative for pain.  Respiratory: Positive for cough. Negative for shortness of breath.   Cardiovascular: Negative for chest pain.  Gastrointestinal:  Negative for nausea, vomiting, abdominal pain and diarrhea.  Genitourinary: Negative for dysuria and hematuria.  Musculoskeletal: Negative for back pain, gait problem and neck pain.  Skin: Negative for color change.  Neurological: Positive for dizziness. Negative for headaches.       Fall  Hematological: Negative for adenopathy.  Psychiatric/Behavioral: Negative for behavioral problems.  All other systems reviewed and are negative.     Allergies  Review of patient's allergies indicates no known allergies.  Home Medications   Prior to Admission medications   Medication Sig Start Date End Date Taking? Authorizing Provider  ALPRAZolam Prudy Feeler) 0.25 MG tablet Take 0.25 mg by mouth at bedtime as needed for anxiety.    Historical Provider, MD  amLODipine (NORVASC) 5 MG tablet Take 5 mg by mouth daily.    Historical Provider, MD  atorvastatin (LIPITOR) 20 MG tablet Take 20 mg by mouth daily.    Historical Provider, MD  carvedilol (COREG) 3.125 MG tablet Take 3.125 mg by mouth 2 (two) times daily with a meal.    Historical Provider, MD  citalopram (CELEXA) 20 MG tablet Take 20 mg by mouth daily.    Historical Provider, MD  donepezil (ARICEPT) 10 MG tablet Take 10 mg by mouth at bedtime.    Historical Provider, MD  esomeprazole (NEXIUM) 40 MG capsule Take 40 mg by mouth daily at 12 noon.    Historical Provider, MD  furosemide (LASIX) 80 MG tablet Take 80 mg by mouth 2 (two) times  daily.    Historical Provider, MD  gabapentin (NEURONTIN) 300 MG capsule Take 300 mg by mouth 2 (two) times daily.    Historical Provider, MD  insulin NPH-regular Human (NOVOLIN 70/30) (70-30) 100 UNIT/ML injection Inject into the skin. 70 units q am, 40 units qpm    Historical Provider, MD  isosorbide mononitrate (IMDUR) 30 MG 24 hr tablet Take 60 mg by mouth daily.    Historical Provider, MD  metolazone (ZAROXOLYN) 2.5 MG tablet Take 2.5 mg by mouth daily.    Historical Provider, MD  potassium chloride SA  (K-DUR,KLOR-CON) 20 MEQ tablet Take 20 mEq by mouth 2 (two) times daily.    Historical Provider, MD  potassium chloride SA (K-DUR,KLOR-CON) 20 MEQ tablet Take 2 tablets (40 mEq total) by mouth 2 (two) times daily. 06/07/13   Hilario Quarryanielle S Ray, MD  sucralfate (CARAFATE) 1 G tablet Take 1 g by mouth 4 (four) times daily -  with meals and at bedtime.    Historical Provider, MD   BP 89/47  Pulse 54  Temp(Src) 97.7 F (36.5 C) (Oral)  Resp 19  Ht 5\' 4"  (1.626 m)  Wt 207 lb (93.895 kg)  BMI 35.51 kg/m2  SpO2 94% Physical Exam  Nursing note and vitals reviewed. Constitutional: She is oriented to person, place, and time. She appears well-developed and well-nourished.  HENT:  Head: Normocephalic and atraumatic.  Mouth/Throat: Oropharynx is clear and moist. No oropharyngeal exudate.  Eyes: Conjunctivae are normal.  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 Right eye is opacified.  Neck: Normal range of motion. Neck supple.  No vertebral tenderness to palpation.  Cardiovascular: Normal rate, regular rhythm, normal heart sounds and intact distal pulses.  Exam reveals no gallop and no friction rub.   No murmur heard. Pulmonary/Chest: Effort normal and breath sounds normal. No respiratory distress. She has no wheezes.  Abdominal: Soft. Bowel sounds are normal. There is no tenderness. There is no rebound and no guarding.  Musculoskeletal: Normal range of motion. She exhibits no edema and no tenderness.  Neurological: She is alert and oriented to person, place, and time.  alert, oriented x3 speech: normal in context and clarity memory: intact grossly cranial nerves II-XII: intact motor strength: full proximally and distally no involuntary movements or tremors sensation: intact to light touch diffusely  gait: deferred   Skin: Skin is warm and dry.  Psychiatric: She has a normal mood and affect. Her behavior is normal.    ED Course  Procedures (including critical care time) Labs Review Labs Reviewed  CBC WITH  DIFFERENTIAL - Abnormal; Notable for the following:    HCT 35.7 (*)    All other components within normal limits  COMPREHENSIVE METABOLIC PANEL - Abnormal; Notable for the following:    Potassium 3.5 (*)    Chloride 89 (*)    Glucose, Bld 211 (*)    BUN 76 (*)    Creatinine, Ser 5.70 (*)    Albumin 3.2 (*)    Total Bilirubin <0.2 (*)    GFR calc non Af Amer 6 (*)    GFR calc Af Amer 7 (*)    Anion gap 24 (*)    All other components within normal limits  URINE RAPID DRUG SCREEN (HOSP PERFORMED) - Abnormal; Notable for the following:    Benzodiazepines POSITIVE (*)    All other components within normal limits  COMPREHENSIVE METABOLIC PANEL - Abnormal; Notable for the following:    Potassium 3.4 (*)    Glucose, Bld 213 (*)  BUN 75 (*)    Creatinine, Ser 4.78 (*)    Calcium 7.8 (*)    Total Protein 5.9 (*)    Albumin 2.7 (*)    Total Bilirubin <0.2 (*)    GFR calc non Af Amer 8 (*)    GFR calc Af Amer 9 (*)    Anion gap 17 (*)    All other components within normal limits  CBC - Abnormal; Notable for the following:    RBC 3.46 (*)    Hemoglobin 10.2 (*)    HCT 31.0 (*)    All other components within normal limits  HEMOGLOBIN A1C - Abnormal; Notable for the following:    Hemoglobin A1C 8.8 (*)    Mean Plasma Glucose 206 (*)    All other components within normal limits  GLUCOSE, CAPILLARY - Abnormal; Notable for the following:    Glucose-Capillary 153 (*)    All other components within normal limits  GLUCOSE, CAPILLARY - Abnormal; Notable for the following:    Glucose-Capillary 186 (*)    All other components within normal limits  GLUCOSE, CAPILLARY - Abnormal; Notable for the following:    Glucose-Capillary 218 (*)    All other components within normal limits  CBG MONITORING, ED - Abnormal; Notable for the following:    Glucose-Capillary 207 (*)    All other components within normal limits  PROTIME-INR  APTT  TROPONIN I  URINALYSIS, ROUTINE W REFLEX MICROSCOPIC   MAGNESIUM  GLUCOSE, CAPILLARY  TSH  I-STAT CG4 LACTIC ACID, ED    Imaging Review Dg Chest 2 View  08/18/2013   CLINICAL DATA:  Dizziness, hypotension, fall today  EXAM: CHEST  2 VIEW  COMPARISON:  06/07/2013  FINDINGS: Linear right lower lobe and left mid lung zone scarring are stable. Evidence of CABG noted with stable rightward position of the inferior most 2 sternal wires as compared to the superior 5 sternal wires. Heart size is mildly enlarged without evidence for edema. No pleural effusion. No acute osseous finding.  IMPRESSION: Mild cardiomegaly with bibasilar scarring but no acute finding.   Electronically Signed   By: Christiana Pellant M.D.   On: 08/18/2013 12:57   Ct Head Wo Contrast  08/18/2013   ADDENDUM REPORT: 08/18/2013 14:14   Electronically Signed   By: Maryclare Bean M.D.   On: 08/18/2013 14:14   08/18/2013   CLINICAL DATA:  Dizziness  EXAM: CT HEAD WITHOUT CONTRAST  TECHNIQUE: Contiguous axial images were obtained from the base of the skull through the vertex without intravenous contrast.  COMPARISON:  None.  FINDINGS: Global atrophy. Chronic ischemic changes in the periventricular white matter. No mass effect, midline shift, or acute hemorrhage. Inflammatory changes in the sphenoid sinuses. Mastoid air cells are clear.  IMPRESSION: No acute intracranial pathology.  Electronically Signed: By: Maryclare Bean M.D. On: 08/18/2013 13:43   Dg Chest Port 1 View  08/19/2013   CLINICAL DATA:  Shortness of breath  EXAM: PORTABLE CHEST - 1 VIEW  COMPARISON:  08/18/2013  FINDINGS: Cardiac shadow is stable. Postsurgical changes are again seen. Lungs are well aerated bilaterally. Minimal right basilar scarring is again noted. No focal confluent infiltrate or sizable effusion is noted.  IMPRESSION: Right basilar scarring.  No acute abnormality is noted.   Electronically Signed   By: Alcide Clever M.D.   On: 08/19/2013 08:52     EKG Interpretation   Date/Time:  Sunday August 18 2013 11:35:46  EDT Ventricular Rate:  59 PR Interval:  168 QRS Duration: 138 QT Interval:  546 QTC Calculation: 540 R Axis:   125 Text Interpretation:  Sinus bradycardia Non-specific intra-ventricular  conduction block Possible Lateral infarct , age undetermined Abnormal ECG  Confirmed by Shaquia Berkley  MD, Mickell Birdwell ([id number]) on 08/18/2013 12:59:27 PM      MDM   Final diagnoses:  Dizziness  Renal failure  Fall, initial encounter  Prolonged QT interval    12:48 PM 80 y.o. female who pw dizziness. She states that she began feeling dizzy yesterday afternoon and it worsened upon awakening this morning. She notes her dizziness is worse with standing. She was coughing this morning and attempted to get up at a restaurant when she became more dizzy and fell to the ground. She denies syncope or loss of consciousness but she did hit her head on the ground. She is afebrile and mildly hypotensive here. Will get screening labs and imaging. She denies any pain currently on exam. Will give 500cc IVF bolus.   Plan for admission. Pt will be transferred to [facility].       Junius Argyle, MD 08/20/13 [phone]

## 2013-08-19 ENCOUNTER — Inpatient Hospital Stay (HOSPITAL_COMMUNITY): Payer: Medicare Other

## 2013-08-19 DIAGNOSIS — E860 Dehydration: Secondary | ICD-10-CM

## 2013-08-19 DIAGNOSIS — I959 Hypotension, unspecified: Secondary | ICD-10-CM

## 2013-08-19 DIAGNOSIS — E876 Hypokalemia: Secondary | ICD-10-CM

## 2013-08-19 DIAGNOSIS — N189 Chronic kidney disease, unspecified: Secondary | ICD-10-CM

## 2013-08-19 LAB — COMPREHENSIVE METABOLIC PANEL
ALT: 5 U/L (ref 0–35)
AST: 9 U/L (ref 0–37)
Albumin: 2.7 g/dL — ABNORMAL LOW (ref 3.5–5.2)
Alkaline Phosphatase: 60 U/L (ref 39–117)
Anion gap: 17 — ABNORMAL HIGH (ref 5–15)
BUN: 75 mg/dL — AB (ref 6–23)
CALCIUM: 7.8 mg/dL — AB (ref 8.4–10.5)
CO2: 24 meq/L (ref 19–32)
Chloride: 96 mEq/L (ref 96–112)
Creatinine, Ser: 4.78 mg/dL — ABNORMAL HIGH (ref 0.50–1.10)
GFR, EST AFRICAN AMERICAN: 9 mL/min — AB (ref >90)
GFR, EST NON AFRICAN AMERICAN: 8 mL/min — AB (ref >90)
GLUCOSE: 213 mg/dL — AB (ref 70–99)
Potassium: 3.4 mEq/L — ABNORMAL LOW (ref 3.7–5.3)
SODIUM: 137 meq/L (ref 137–147)
Total Bilirubin: <0.2 mg/dL — ABNORMAL LOW (ref 0.3–1.2)
Total Protein: 5.9 g/dL — ABNORMAL LOW (ref 6.0–8.3)

## 2013-08-19 LAB — CBC
HCT: 31 % — ABNORMAL LOW (ref 36.0–46.0)
HEMOGLOBIN: 10.2 g/dL — AB (ref 12.0–15.0)
MCH: 29.5 pg (ref 26.0–34.0)
MCHC: 32.9 g/dL (ref 30.0–36.0)
MCV: 89.6 fL (ref 78.0–100.0)
PLATELETS: 248 10*3/uL (ref 150–400)
RBC: 3.46 MIL/uL — ABNORMAL LOW (ref 3.87–5.11)
RDW: 13.9 % (ref 11.5–15.5)
WBC: 6.8 10*3/uL (ref 4.0–10.5)

## 2013-08-19 LAB — URINALYSIS, ROUTINE W REFLEX MICROSCOPIC
Bilirubin Urine: NEGATIVE
GLUCOSE, UA: NEGATIVE mg/dL
Hgb urine dipstick: NEGATIVE
Ketones, ur: NEGATIVE mg/dL
Leukocytes, UA: NEGATIVE
NITRITE: NEGATIVE
PROTEIN: NEGATIVE mg/dL
Specific Gravity, Urine: 1.017 (ref 1.005–1.030)
Urobilinogen, UA: 0.2 mg/dL (ref 0.0–1.0)
pH: 5 (ref 5.0–8.0)

## 2013-08-19 LAB — GLUCOSE, CAPILLARY
GLUCOSE-CAPILLARY: 246 mg/dL — AB (ref 70–99)
Glucose-Capillary: 186 mg/dL — ABNORMAL HIGH (ref 70–99)
Glucose-Capillary: 218 mg/dL — ABNORMAL HIGH (ref 70–99)
Glucose-Capillary: 326 mg/dL — ABNORMAL HIGH (ref 70–99)

## 2013-08-19 LAB — RAPID URINE DRUG SCREEN, HOSP PERFORMED
Amphetamines: NOT DETECTED
Barbiturates: NOT DETECTED
Benzodiazepines: POSITIVE — AB
Cocaine: NOT DETECTED
Opiates: NOT DETECTED
Tetrahydrocannabinol: NOT DETECTED

## 2013-08-19 LAB — TSH: TSH: 0.649 u[IU]/mL (ref 0.350–4.500)

## 2013-08-19 LAB — HEMOGLOBIN A1C
HEMOGLOBIN A1C: 8.8 % — AB (ref <5.7)
Mean Plasma Glucose: 206 mg/dL — ABNORMAL HIGH (ref <117)

## 2013-08-19 MED ORDER — ACETAMINOPHEN 325 MG PO TABS
650.0000 mg | ORAL_TABLET | ORAL | Status: DC | PRN
Start: 2013-08-19 — End: 2013-08-19
  Administered 2013-08-19: 650 mg via ORAL
  Filled 2013-08-19: qty 2

## 2013-08-19 MED ORDER — HYDROCOD POLST-CHLORPHEN POLST 10-8 MG/5ML PO LQCR
5.0000 mL | Freq: Once | ORAL | Status: AC
  Administered 2013-08-19: 5 mL via ORAL
  Filled 2013-08-19: qty 5

## 2013-08-19 MED ORDER — SODIUM CHLORIDE 0.9 % IV SOLN
INTRAVENOUS | Status: AC
  Administered 2013-08-19 – 2013-08-20 (×2): via INTRAVENOUS

## 2013-08-19 MED ORDER — HYDROCOD POLST-CHLORPHEN POLST 10-8 MG/5ML PO LQCR
5.0000 mL | Freq: Once | ORAL | Status: AC
  Administered 2013-08-20: 5 mL via ORAL
  Filled 2013-08-19: qty 5

## 2013-08-19 MED ORDER — ALBUTEROL SULFATE (2.5 MG/3ML) 0.083% IN NEBU
2.5000 mg | INHALATION_SOLUTION | RESPIRATORY_TRACT | Status: DC | PRN
  Administered 2013-08-21 – 2013-08-22 (×2): 2.5 mg via RESPIRATORY_TRACT
  Filled 2013-08-19 (×2): qty 3

## 2013-08-19 MED ORDER — GUAIFENESIN-DM 100-10 MG/5ML PO SYRP
5.0000 mL | ORAL_SOLUTION | ORAL | Status: DC | PRN
  Administered 2013-08-19 – 2013-08-20 (×2): 5 mL via ORAL
  Filled 2013-08-19 (×2): qty 5

## 2013-08-19 MED ORDER — ACETAMINOPHEN 325 MG PO TABS
650.0000 mg | ORAL_TABLET | Freq: Four times a day (QID) | ORAL | Status: DC | PRN
  Administered 2013-08-19 – 2013-08-22 (×5): 650 mg via ORAL
  Filled 2013-08-19 (×5): qty 2

## 2013-08-19 MED ORDER — POTASSIUM CHLORIDE CRYS ER 20 MEQ PO TBCR
40.0000 meq | EXTENDED_RELEASE_TABLET | Freq: Once | ORAL | Status: AC
  Administered 2013-08-19: 40 meq via ORAL
  Filled 2013-08-19: qty 2

## 2013-08-19 MED ORDER — INSULIN ASPART 100 UNIT/ML ~~LOC~~ SOLN
0.0000 [IU] | Freq: Three times a day (TID) | SUBCUTANEOUS | Status: DC
  Administered 2013-08-19: 4 [IU] via SUBCUTANEOUS
  Administered 2013-08-19: 3 [IU] via SUBCUTANEOUS

## 2013-08-19 NOTE — Progress Notes (Signed)
Inpatient Diabetes Program Recommendations  AACE/ADA: New Consensus Statement on Inpatient Glycemic Control (2013)  Target Ranges:  Prepandial:   less than 140 mg/dL      Peak postprandial:   less than 180 mg/dL (1-2 hours)      Critically ill patients:  140 - 180 mg/dL     Results for Christy Swanson, Christy Swanson (MRN [id number]) as of 08/19/2013 13:42  Ref. Range 08/19/2013 07:44 08/19/2013 12:14  Glucose-Capillary Latest Range: 70-99 mg/dL 782186 (H) 956218 (H)     Home DM Meds: 70/30 insulin- 60 units in the AM/ 40 units in the PM   **Note patient currently getting Novolog Sensitive SSI at present.  Having some glucose elevations.  Ate 100% of breakfast this AM.   MD- If patient continue to have glucose excursions, may want to consider adding a portion of his home 70/30 insulin back to regimen or could use Lantus instead while here in hospital.  If decision made to restart 70/30 insulin, would try 1/3 of patient's home dose and titrate upward as needed.    Will follow Ambrose FinlandJeannine Johnston Kaitelyn Jamison RN, MSN, CDE Diabetes Coordinator Inpatient Diabetes Program Team Pager: [phone] (8a-10p)

## 2013-08-19 NOTE — Progress Notes (Signed)
PROGRESS NOTE    Christy Swanson ZOX:[id number] DOB: Sep 27, 1931 DOA: 08/18/2013 PCP: No primary provider on file. PCP & Cardiologist in [place], [place].  HPI/Brief narrative 80 year old female patient with history of type II DM with renal complications, chronic kidney disease and baseline creatinine of 1.3, hypertension, CAD status post CABG, OSA on nightly CPAP and oxygen via nasal cannula at 2 L per minute, blind in both eyes, admitted on 08/18/13 with complaints of dizziness, weakness, lightheadedness-worse in upright position, a fall on 07/18/13 without LOC. She complains of mild intermittent dyspnea and nonproductive cough in the absence of fever or chills. In the ED, she was found to be in acute renal failure with creatinine of 5.7, CT head without acute findings and chest x-ray without acute findings.     Assessment/Plan:  1. Symptomatic hypotension: Secondary to dehydration and polypharmacy-diuretics and antihypertensives. She had systolic blood pressures in the 70s on admission. Responded well to IV fluids. Blood pressure is still soft. Hold antihypertensives and diuretics. 2. Acute on stage III chronic kidney disease: Baseline creatinine may be 1.3. Possibly secondary to dehydration and ATN from hypotension. Creatinine has improved from 5.7-4.7. Continue gentle IV fluids. Strict I intake and output. 3. Hypertension: Admitted with hypotension which has improved. Hold antihypertensives. 4. Type II DM with renal complications: CBCs fluctuating and mildly uncontrolled. Home 78/30 insulin on hold. Continue SSI. May consider low-dose Lantus if consistently elevated. 5. History of CAD status post CABG: No chest pain. Continue atorvastatin. Holding carvedilol secondary to soft blood pressures and hypotension on admission.? History of CHF-unclear why she was on diuretics-currently on hold. 6. History of hyperlipidemia: Continue atorvastatin 7. Hypokalemia: Replace and follow BMP 8. OSA on nightly CPAP:  Continue nightly CPAP and bedtime oxygen. 9. Anemia: May be chronic and current drop may be dilutional. No reported bleeding. Follow CBC in a.m.  10. Asymptomatic sinus bradycardia:? Residual effects of carvedilol in the context of renal failure. Check TSH.   Code Status: Full Family Communication: discussed with daughter [name]. Disposition Plan: Home when medically stable. Patient lives with her spouse and daughter.   Consultants:  None  Procedures:  None  Antibiotics:  None   Subjective: Feels much better. Improving urine output. Denies pain. Intermittent dyspnea. Cough improved.  Objective: Filed Vitals:   08/18/13 1723 08/18/13 2134 08/19/13 0603 08/19/13 0952  BP: 74/36 117/92 117/48 106/47  Pulse: 52 54 58 58  Temp: 98 F (36.7 C) 97.6 F (36.4 C) 98.2 F (36.8 C) 98.2 F (36.8 C)  TempSrc: Oral Oral Oral Oral  Resp: 18 18 18 18   Height: 5\' 4"  (1.626 m)     Weight: 99.1 kg (218 lb 7.6 oz) 101.1 kg (222 lb 14.2 oz)    SpO2: 94% 98% 99% 98%    Intake/Output Summary (Last 24 hours) at 08/19/13 1306 Last data filed at 08/19/13 0900  Gross per 24 hour  Intake   1860 ml  Output    200 ml  Net   1660 ml   Filed Weights   08/18/13 1110 08/18/13 1723 08/18/13 2134  Weight: 93.895 kg (207 lb) 99.1 kg (218 lb 7.6 oz) 101.1 kg (222 lb 14.2 oz)     Exam:  General exam: Pleasant elderly obese female lying comfortably propped up in bed. Respiratory system: Slightly diminished breath sounds in the bases but no wheezing, rhonchi or crackles. Rest of lung fields clear to auscultation. No increased work of breathing. Cardiovascular system: S1 & S2 heard, regular  sinus bradycardia. No JVD, murmurs, gallops, clicks or pedal edema. Telemetry: Sinus bradycardia in the 50s mostly. Occasionally in the 40s. Gastrointestinal system: Abdomen is nondistended, soft and nontender. Normal bowel sounds heard. Central nervous system: Alert and oriented. No focal  neurological deficits. Blindness bilaterally. Extremities: Symmetric 5 x 5 power.   Data Reviewed: Basic Metabolic Panel:  Recent Labs Lab 08/18/13 1244 08/18/13 1322 08/19/13 0505  NA 137  --  137  K 3.5*  --  3.4*  CL 89*  --  96  CO2 24  --  24  GLUCOSE 211*  --  213*  BUN 76*  --  75*  CREATININE 5.70*  --  4.78*  CALCIUM 9.1  --  7.8*  MG  --  2.1  --    Liver Function Tests:  Recent Labs Lab 08/18/13 1244 08/19/13 0505  AST 13 9  ALT 6 5  ALKPHOS 66 60  BILITOT <0.2* <0.2*  PROT 6.9 5.9*  ALBUMIN 3.2* 2.7*   No results found for this basename: LIPASE, AMYLASE,  in the last 168 hours No results found for this basename: AMMONIA,  in the last 168 hours CBC:  Recent Labs Lab 08/18/13 1244 08/19/13 0505  WBC 7.3 6.8  NEUTROABS 4.2  --   HGB 12.1 10.2*  HCT 35.7* 31.0*  MCV 89.0 89.6  PLT PLATELET CLUMPS NOTED ON SMEAR, COUNT APPEARS ADEQUATE 248   Cardiac Enzymes:  Recent Labs Lab 08/18/13 1322  TROPONINI <0.30   BNP (last 3 results) No results found for this basename: PROBNP,  in the last 8760 hours CBG:  Recent Labs Lab 08/18/13 1118 08/18/13 1720 08/18/13 2132 08/19/13 0744 08/19/13 1214  GLUCAP 207* 78 153* 186* 218*    No results found for this or any previous visit (from the past 240 hour(s)).     Studies: Dg Chest 2 View  08/18/2013   CLINICAL DATA:  Dizziness, hypotension, fall today  EXAM: CHEST  2 VIEW  COMPARISON:  06/07/2013  FINDINGS: Linear right lower lobe and left mid lung zone scarring are stable. Evidence of CABG noted with stable rightward position of the inferior most 2 sternal wires as compared to the superior 5 sternal wires. Heart size is mildly enlarged without evidence for edema. No pleural effusion. No acute osseous finding.  IMPRESSION: Mild cardiomegaly with bibasilar scarring but no acute finding.   Electronically Signed   By: Christiana PellantGretchen  Green M.D.   On: 08/18/2013 12:57   Ct Head Wo Contrast  08/18/2013    ADDENDUM REPORT: 08/18/2013 14:14   Electronically Signed   By: Maryclare BeanArt  Hoss M.D.   On: 08/18/2013 14:14   08/18/2013   CLINICAL DATA:  Dizziness  EXAM: CT HEAD WITHOUT CONTRAST  TECHNIQUE: Contiguous axial images were obtained from the base of the skull through the vertex without intravenous contrast.  COMPARISON:  None.  FINDINGS: Global atrophy. Chronic ischemic changes in the periventricular white matter. No mass effect, midline shift, or acute hemorrhage. Inflammatory changes in the sphenoid sinuses. Mastoid air cells are clear.  IMPRESSION: No acute intracranial pathology.  Electronically Signed: By: Maryclare BeanArt  Hoss M.D. On: 08/18/2013 13:43   Dg Chest Port 1 View  08/19/2013   CLINICAL DATA:  Shortness of breath  EXAM: PORTABLE CHEST - 1 VIEW  COMPARISON:  08/18/2013  FINDINGS: Cardiac shadow is stable. Postsurgical changes are again seen. Lungs are well aerated bilaterally. Minimal right basilar scarring is again noted. No focal confluent infiltrate or sizable effusion is noted.  IMPRESSION: Right basilar scarring.  No acute abnormality is noted.   Electronically Signed   By: Alcide Clever M.D.   On: 08/19/2013 08:52        Scheduled Meds: . atorvastatin  20 mg Oral Daily  . citalopram  20 mg Oral Daily  . donepezil  10 mg Oral QHS  . heparin  5,000 Units Subcutaneous 3 times per day  . insulin aspart  0-9 Units Subcutaneous TID WC  . pantoprazole  40 mg Oral Daily  . sodium chloride  3 mL Intravenous Q12H  . sucralfate  1 g Oral TID WC & HS   Continuous Infusions: . sodium chloride 75 mL/hr at 08/19/13 0845    Principal Problem:   Renal failure Active Problems:   CAD (coronary artery disease)   S/P CABG (coronary artery bypass graft)   Acute renal failure   Dehydration   Hypotension, unspecified   Diabetes mellitus   Glaucoma   Blindness   Essential hypertension, benign   Other and unspecified hyperlipidemia    Time spent: 40 minutes    Jovante Hammitt, MD, FACP, FHM. [facility] Pager [phone]  If 7PM-7AM, please contact night-coverage [email or web address] Password [email or web address] 08/19/2013, 1:06 PM    LOS: 1 day

## 2013-08-19 NOTE — Progress Notes (Signed)
Physical Therapy Evaluation Patient Details Name: Christy DialDorothy Swanson MRN: [id number] DOB: 14-Mar-1931 Today's Date: 08/19/2013   History of Present Illness  80 y.o. F admitted 08/18/13 c/o dizziness, weakness, lightheadedness-worse in upright position, fall on 07/18/13 without LOC, mild intermittent dyspnea and nonproductive cough and she was found to be in acute renal failure. CT and chest XR neg. PMHx of DM 2 with renal complications, CKD, HTN, CAD s/p CABG, OSA on nightly CPAP and O2 via Riva at 2 L, and blind in both eyes.  Clinical Impression  Pt amb 20' with 2 person hand held assist for tactile cues. PT to recommend HHPT to increase independence and pt states she has HHPT in [place]. Pt also has supervision of spouse, daughter, and aide at discharge. PT to follow acutely for the below listed deficits.    Follow Up Recommendations Home health PT (Pt states she already has HHPT in [place])    Equipment Recommendations  None recommended by PT       Precautions / Restrictions Precautions Precautions: Fall Precaution Comments: Showed pt where call button was on bed to her right and had her push Restrictions Weight Bearing Restrictions: No      Mobility   Transfers Overall transfer level: Needs assistance Equipment used: 2 person hand held assist Transfers: Sit to/from Stand Sit to Stand: +2 physical assistance;Mod assist         General transfer comment: +2 person mod assist for pt tactile cues and as first time up with notes of dizziness and lightheadedness (pt safety).  Ambulation/Gait Ambulation/Gait assistance: +2 physical assistance;Min assist Ambulation Distance (Feet): 20 Feet Assistive device: 2 person hand held assist Gait Pattern/deviations: Step-through pattern Gait velocity: Decreased Gait velocity interpretation: Below normal speed for age/gender General Gait Details: Pt amb to the bathroom and back with 2 person hand held assistance for directional cues as doesn't have  cane for the blind with her.       Balance Overall balance assessment: Needs assistance Sitting-balance support: Feet supported;Bilateral upper extremity supported Sitting balance-Leahy Scale: Good     Standing balance support: Bilateral upper extremity supported;During functional activity Standing balance-Leahy Scale: Poor                               Pertinent Vitals/Pain Orthostatic: Lying BP 115/52 HR 65, Sitting BP 119/50 HR 60, standing vitals- multiple attempts wouldn't take and pt too fatigued to keep standing     Home Living Family/patient expects to be discharged to:: Private residence Living Arrangements: Spouse/significant other;Children Available Help at Discharge: Available 24 hours/day (Aide) Type of Home: House Home Access: Level entry     Home Layout: One level Home Equipment: Cane - single point [employer](Cane for the blind) Additional Comments: States she does not have stairs but there is a step built to make it easier for her to enter the house    Prior Function Level of Independence: Independent with assistive device(s)         Comments: Cane for the blind     Hand Dominance   Dominant Hand: Right    Extremity/Trunk Assessment               Lower Extremity Assessment: Overall WFL for tasks assessed (4/5 both R and L LE)         Communication   Communication: No difficulties  Cognition Arousal/Alertness: Awake/alert Behavior During Therapy: WFL for tasks assessed/performed Overall Cognitive Status: Within Functional Limits  for tasks assessed                               Assessment/Plan    PT Assessment Patient needs continued PT services  PT Diagnosis Difficulty walking   PT Problem List Decreased strength;Decreased range of motion;Decreased activity tolerance;Decreased balance;Decreased mobility;Decreased coordination;Decreased knowledge of precautions;Obesity  PT Treatment Interventions Gait training;Stair  training;Functional mobility training;Therapeutic activities;Therapeutic exercise;Balance training;Modalities   PT Goals (Current goals can be found in the Care Plan section) Acute Rehab PT Goals PT Goal Formulation: With patient Time For Goal Achievement: 09/02/13 Potential to Achieve Goals: Good    Frequency Min 3X/week    End of Session Equipment Utilized During Treatment: Gait belt Activity Tolerance: Patient limited by fatigue Patient left: in chair;with call bell/phone within reach;with chair alarm set           Time: 1550-1635 PT Time Calculation (min): 45 min   Charges:   PT Evaluation $Initial PT Evaluation Tier I: 1 Procedure PT Treatments $Therapeutic Activity: 23-37 mins   PT G CodesMardi Mainland, SPT [phone]

## 2013-08-19 NOTE — Progress Notes (Signed)
Read, reviewed, edited and agree with student's findings and recommendations.  Ricarda Atayde B. Argus Caraher, PT, DPT #[id number]  

## 2013-08-20 ENCOUNTER — Inpatient Hospital Stay (HOSPITAL_COMMUNITY): Payer: Medicare Other

## 2013-08-20 DIAGNOSIS — N179 Acute kidney failure, unspecified: Secondary | ICD-10-CM

## 2013-08-20 DIAGNOSIS — Z951 Presence of aortocoronary bypass graft: Secondary | ICD-10-CM

## 2013-08-20 DIAGNOSIS — R55 Syncope and collapse: Secondary | ICD-10-CM

## 2013-08-20 DIAGNOSIS — I359 Nonrheumatic aortic valve disorder, unspecified: Secondary | ICD-10-CM

## 2013-08-20 DIAGNOSIS — I4729 Other ventricular tachycardia: Secondary | ICD-10-CM | POA: Diagnosis not present

## 2013-08-20 DIAGNOSIS — R9431 Abnormal electrocardiogram [ECG] [EKG]: Secondary | ICD-10-CM

## 2013-08-20 DIAGNOSIS — R079 Chest pain, unspecified: Secondary | ICD-10-CM

## 2013-08-20 DIAGNOSIS — I472 Ventricular tachycardia, unspecified: Secondary | ICD-10-CM

## 2013-08-20 LAB — TROPONIN I

## 2013-08-20 LAB — GLUCOSE, CAPILLARY
GLUCOSE-CAPILLARY: 296 mg/dL — AB (ref 70–99)
Glucose-Capillary: 271 mg/dL — ABNORMAL HIGH (ref 70–99)
Glucose-Capillary: 259 mg/dL — ABNORMAL HIGH (ref 70–99)
Glucose-Capillary: 198 mg/dL — ABNORMAL HIGH (ref 70–99)

## 2013-08-20 LAB — CBC
HEMATOCRIT: 35.8 % — AB (ref 36.0–46.0)
Hemoglobin: 11.7 g/dL — ABNORMAL LOW (ref 12.0–15.0)
MCH: 29.9 pg (ref 26.0–34.0)
MCHC: 32.7 g/dL (ref 30.0–36.0)
MCV: 91.6 fL (ref 78.0–100.0)
Platelets: 241 10*3/uL (ref 150–400)
RBC: 3.91 MIL/uL (ref 3.87–5.11)
RDW: 13.8 % (ref 11.5–15.5)
WBC: 6.5 10*3/uL (ref 4.0–10.5)

## 2013-08-20 LAB — BASIC METABOLIC PANEL
ANION GAP: 15 (ref 5–15)
BUN: 59 mg/dL — ABNORMAL HIGH (ref 6–23)
CO2: 24 mEq/L (ref 19–32)
Calcium: 8.7 mg/dL (ref 8.4–10.5)
Chloride: 101 mEq/L (ref 96–112)
Creatinine, Ser: 2.16 mg/dL — ABNORMAL HIGH (ref 0.50–1.10)
GFR calc Af Amer: 24 mL/min — ABNORMAL LOW (ref >90)
GFR calc non Af Amer: 21 mL/min — ABNORMAL LOW (ref >90)
Glucose, Bld: 258 mg/dL — ABNORMAL HIGH (ref 70–99)
POTASSIUM: 4 meq/L (ref 3.7–5.3)
SODIUM: 140 meq/L (ref 137–147)

## 2013-08-20 LAB — D-DIMER, QUANTITATIVE: D-Dimer, Quant: 1.95 ug/mL-FEU — ABNORMAL HIGH (ref 0.00–0.48)

## 2013-08-20 LAB — MAGNESIUM: Magnesium: 1.7 mg/dL (ref 1.5–2.5)

## 2013-08-20 LAB — PRO B NATRIURETIC PEPTIDE: Pro B Natriuretic peptide (BNP): 1444 pg/mL — ABNORMAL HIGH (ref 0–450)

## 2013-08-20 MED ORDER — NITROGLYCERIN 0.4 MG/SPRAY TL SOLN: 1.0000 | Status: DC | PRN

## 2013-08-20 MED ORDER — INSULIN ASPART 100 UNIT/ML ~~LOC~~ SOLN
0.0000 [IU] | Freq: Three times a day (TID) | SUBCUTANEOUS | Status: DC
  Administered 2013-08-20 (×2): 5 [IU] via SUBCUTANEOUS
  Administered 2013-08-21: 2 [IU] via SUBCUTANEOUS
  Administered 2013-08-21: 13:00:00 via SUBCUTANEOUS
  Administered 2013-08-22: 1 [IU] via SUBCUTANEOUS
  Administered 2013-08-22: 3 [IU] via SUBCUTANEOUS
  Administered 2013-08-22: 1 [IU] via SUBCUTANEOUS

## 2013-08-20 MED ORDER — NITROGLYCERIN 0.4 MG SL SUBL
SUBLINGUAL_TABLET | SUBLINGUAL | Status: AC
  Administered 2013-08-20: 0.4 mg
  Filled 2013-08-20: qty 1

## 2013-08-20 MED ORDER — NITROGLYCERIN 0.4 MG SL SUBL
0.4000 mg | SUBLINGUAL_TABLET | SUBLINGUAL | Status: DC | PRN
  Administered 2013-08-20: 0.4 mg via SUBLINGUAL

## 2013-08-20 MED ORDER — INSULIN ASPART 100 UNIT/ML ~~LOC~~ SOLN: 0.0000 [IU] | Freq: Every day | SUBCUTANEOUS | Status: DC

## 2013-08-20 MED ORDER — INSULIN GLARGINE 100 UNIT/ML ~~LOC~~ SOLN
25.0000 [IU] | Freq: Every day | SUBCUTANEOUS | Status: DC
  Administered 2013-08-20 – 2013-08-21 (×2): 25 [IU] via SUBCUTANEOUS
  Filled 2013-08-20 (×2): qty 0.25

## 2013-08-20 MED ORDER — ASPIRIN 325 MG PO TABS
325.0000 mg | ORAL_TABLET | Freq: Every day | ORAL | Status: DC
  Administered 2013-08-20 – 2013-08-22 (×3): 325 mg via ORAL
  Filled 2013-08-20 (×4): qty 1

## 2013-08-20 MED ORDER — HYDROCOD POLST-CHLORPHEN POLST 10-8 MG/5ML PO LQCR
5.0000 mL | Freq: Two times a day (BID) | ORAL | Status: DC | PRN
  Administered 2013-08-20 – 2013-08-22 (×3): 5 mL via ORAL
  Filled 2013-08-20 (×3): qty 5

## 2013-08-20 NOTE — Progress Notes (Signed)
Per morning RN, MD wants patient to have a peripheral access. IV team called to bedside, unsuccessful twice due to patient's restlessness and confusion. Per IV nurse, patient has pulled out IV access 4x. PA on call notified. Safety sitter at bedside as ordered. Will continue to monitor patient.

## 2013-08-20 NOTE — Progress Notes (Signed)
Placed patient on CPAP for the night via auto-mode with minimum pressure set at 4cm and maximum pressure set at 20cm.  

## 2013-08-20 NOTE — Progress Notes (Signed)
Called and spoke with Nuc [facility] regarding the necessary procedure to have patients perfusion study redone. Per Tacey RuizLeah, due to the amount of time between when patient received the first dose of radiation, Nuc med is unable to resume the test today. Stated that she feels uncomfortable completing the test due to patients "refusal" and "combative behavior" while down for the test earlier. Explained to Tacey RuizLeah that patients Mental status changes from calm and agreeable to confused and at times mildly combative. Stated that even if patient is premedicated, she will not be able to resume the test for 24 hours. Text paged Dr. Al CorpusHongali to make aware. Hart Haas, Charlyne Qualeamara Johnson

## 2013-08-20 NOTE — Code Documentation (Signed)
CODE BLUE NOTE  Patient Name: Christy Swanson   MRN: [id number]   Date of Birth/ Sex: 1931/08/16 , female      Admission Date: 08/18/2013  Attending Provider: Elease EtienneAnand D Hongalgi, MD  Primary Diagnosis: Renal failure (ARF), acute on chronic    Indication: Pt was in her usual state of health until about 1000 AM, when she was noted to be unresponsive for 3-4secs. She was talking in the room to the nurse when she became unresponsive. Code blue was subsequently called. At the time of arrival on scene, patient was alert and talking. She was complaining of some chest pain.   Technical Description:  - CPR performance duration:  None  - Was defibrillation or cardioversion used? No   - Was external pacer placed? No  - Was patient intubated pre/post CPR? No    Medications Administered: Y = Yes; Blank = No Amiodarone    Atropine    Calcium    Epinephrine    Lidocaine    Magnesium    Norepinephrine    Phenylephrine    Sodium bicarbonate    Vasopressin    Nitroglycerin sublingual and ASA given.  Post CPR evaluation:  - Final Status - Was patient successfully resuscitated ? Yes - What is current rhythm? Normal sinus   - What is current hemodynamic status? Stable   Miscellaneous Information:  - Labs sent, including: Cardiac enzymes, BNP, Mg  - Primary team notified?  At bedside  - Family Notified? Yes, will call daughter  - Additional notes/ transfer status: N/A     Physicians Responding to Code Blue: Caryl AdaJazma Savhanna Sliva, DO, Family Medicine PGY-1   08/20/2013, 10:32 AM

## 2013-08-20 NOTE — Progress Notes (Signed)
Patient is confused,hard to re-direct and at times physically aggressive to the staff.Piv was accidentally pulled out due to patient being restless and moderately combative later on.Nurse reminded patient that she needs to relax and explained to her that she might have another episode of chest pain becouse she is tiring herself and first thing she needs to do is relax,and that instant patient somewhat mellowed down until her daughter arrived around [id number].Bed alarm set in for safety.

## 2013-08-20 NOTE — Progress Notes (Signed)
Code Blue called at 1023.  Upon my arrival to patients room, RN and MD at bedside.  Patient was sitting up in bed talking and moaning.  AS per RN, Patient was sitting in chair, screaming out about chest pain and then suddenly went unresponsive and code blue called at that time.  Patient given 1 SL nitro for chest pain, attempting to get EKG labs ordered, 180/90, SPo2 100%.  Patient placed on nasal cannula.  2nd SL nitro given for CP 7/10.  Code blue was cancelled.  PCXR being done.  Rn to call if assistance needed

## 2013-08-20 NOTE — Progress Notes (Signed)
PT Cancellation Note  Patient Details Name: Christy DialDorothy Swanson MRN: [id number] DOB: [birth year]   Cancelled Treatment:    Reason Eval/Treat Not Completed: Medical issues which prohibited therapy Holding PT treatment at this time per request of RN as pt with episode of unresponsiveness while sitting in chair this morning around 10:23 am with code blue called and then cancelled as pt came too within a few seconds. RN reports pt needs to rest as she is more confused this AM. Will follow up later in PM if time allows, or on 8/5.    Alvie HeidelbergFolan, Stormi Vandevelde A 08/20/2013, 11:51 AM Alvie HeidelbergShauna Folan, PT, DPT [phone]

## 2013-08-20 NOTE — Progress Notes (Addendum)
Christy Swanson EAV:[id number] DOB: 10-18-1931 DOA: 08/18/2013 PCP: No primary provider on file. PCP & Cardiologist in [place], [place].  HPI/Brief narrative 80 year old female patient with history of type II DM with renal complications, chronic kidney disease and baseline creatinine of 1.3, hypertension, CAD status post CABG, OSA on nightly CPAP and oxygen via nasal cannula at 2 L per minute, blind in both eyes, admitted on 08/18/13 with complaints of dizziness, weakness, lightheadedness-worse in upright position, a fall on 07/18/13 without LOC. She complains of mild intermittent dyspnea and nonproductive cough in the absence of fever or chills. In the ED, she was found to be in acute renal failure with creatinine of 5.7, CT head without acute findings and chest x-ray without acute findings.     Assessment/Plan:   1. Chest pain: Patient did not present with chest pain. On 08/20/13 at approximately 10:15 AM, she apparently became agitated, crying, confused and complained of central chest pain. She briefly passed out for ~ 30 seconds. She did not have apnea and maintained her pulse and blood pressure. EKG-without acute changes and unchanged from prior EKG. Cycle troponin. Check d-dimer and proBNP. IV fluids held. Chest x-ray- no acute findings. Chest pain resolved after sublingual nitroglycerin x2. Will request 2-D echo. Cardiology consulted. Started ASA. 2. Brief syncope: Unclear etiology.? Vasovagal versus orthostatic hypotension. Continue monitoring on telemetry. Check orthostatic blood pressures. Check 2-D echo and carotid Dopplers. 3. NSVT x1: patient had 11 beat NSVT at 4 9 AM on 8/4. Potassium 4. Check magnesium- 2.1 on 8/1. Carvedilol held on admission secondary to hypotension and persistent bradycardia in the 40s on 8/3.Resume carvedilol. 4. ? Prolonged QTC: Probably cannot interpret QTC correctly in the context of chronic wide QRC.  5. Symptomatic hypotension/HTN: Secondary to  dehydration and polypharmacy-diuretics and antihypertensives. She had systolic blood pressures in the 70s on admission. Resolved after IV fluids. Blood pressure starting to rise. Resume carvedilol. 6. Acute on stage III chronic kidney disease: Baseline creatinine may be 1.3. Possibly secondary to dehydration and ATN from hypotension. Creatinine has improved from 5.7-4.7-2.16. Improved after IV fluids. DC IVF on 8/4 due to c/o SOB although clinically chest appears clear. Follow BMP in a.m. 7. Type II DM with renal complications: CBCs fluctuating and mildly uncontrolled. Home 70/30 insulin on hold. Continue SSI. Lantus added. A1C: 8.8 8. History of CAD status post CABG: Continue atorvastatin. Held carvedilol secondary to hypotension on admission.? History of CHF-unclear why she was on diuretics-currently on hold. SB better. Resume Carvedilol which was held. 9. History of hyperlipidemia: Continue atorvastatin 10. Hypokalemia: Replaced 11. OSA on nightly CPAP: Continue nightly CPAP and bedtime oxygen. 12. Anemia: May be chronic and current drop may be dilutional. No reported bleeding. Stable. 13. Asymptomatic sinus bradycardia:? Residual effects of carvedilol in the context of renal failure.  TSH 0.649.   Code Status: Full Family Communication: left message for daughter [name]. Disposition Plan: Home when medically stable. Patient lives with her spouse and daughter.   Consultants:  Cardiology- pending  Procedures:  None  Antibiotics:  None   Subjective: When seen initially this morning, she denied complaints including cough. However per nursing she had excessive coughing at night unresponsive to Robitussin DM, but improved after Tussionex. Subsequently she became agitated, confused and complained of central chest pain and SOB and briefly became unresponsive while sitting on chair.  Objective: Filed Vitals:   08/20/13 1027 08/20/13 1032 08/20/13 1037 08/20/13 1042  BP:  194/80 181/80  167/80 151/72  Pulse: 94     Temp:      TempSrc:      Resp:      Height:      Weight:      SpO2: 98%      respiratory 21 per minute, temperature 98.20F.  Intake/Output Summary (Last 24 hours) at 08/20/13 1111 Last data filed at 08/20/13 0940  Gross per 24 hour  Intake   1140 ml  Output   3120 ml  Net  -1980 ml   Filed Weights   08/18/13 1723 08/18/13 2134 08/19/13 2109  Weight: 99.1 kg (218 lb 7.6 oz) 101.1 kg (222 lb 14.2 oz) 105.1 kg (231 lb 11.3 oz)     Exam:  General exam: Pleasant elderly obese female sitting propped up in bed. No further CP/distress. Respiratory system: Slightly diminished breath sounds in the bases but no wheezing, rhonchi or crackles. Rest of lung fields clear to auscultation. No increased work of breathing. Cardiovascular system: S1 & S2 heard, regular sinus bradycardia. No JVD, murmurs, gallops, clicks or pedal edema. Tele:  SR 60's, 11 beat NSVT at 4.09 AM on 08/20/13. Gastrointestinal system: Abdomen is nondistended, soft and nontender. Normal bowel sounds heard. Central nervous system: Alert and oriented. No focal neurological deficits. Blindness bilaterally/b/l corneal opacity. Extremities: Symmetric 5 x 5 power.   Data Reviewed: Basic Metabolic Panel:  Recent Labs Lab 08/18/13 1244 08/18/13 1322 08/19/13 0505 08/20/13 0331  NA 137  --  137 140  K 3.5*  --  3.4* 4.0  CL 89*  --  96 101  CO2 24  --  24 24  GLUCOSE 211*  --  213* 258*  BUN 76*  --  75* 59*  CREATININE 5.70*  --  4.78* 2.16*  CALCIUM 9.1  --  7.8* 8.7  MG  --  2.1  --   --    Liver Function Tests:  Recent Labs Lab 08/18/13 1244 08/19/13 0505  AST 13 9  ALT 6 5  ALKPHOS 66 60  BILITOT <0.2* <0.2*  PROT 6.9 5.9*  ALBUMIN 3.2* 2.7*   No results found for this basename: LIPASE, AMYLASE,  in the last 168 hours No results found for this basename: AMMONIA,  in the last 168 hours CBC:  Recent Labs Lab 08/18/13 1244 08/19/13 0505 08/20/13 0331    WBC 7.3 6.8 6.5  NEUTROABS 4.2  --   --   HGB 12.1 10.2* 11.7*  HCT 35.7* 31.0* 35.8*  MCV 89.0 89.6 91.6  PLT PLATELET CLUMPS NOTED ON SMEAR, COUNT APPEARS ADEQUATE 248 241   Cardiac Enzymes:  Recent Labs Lab 08/18/13 1322  TROPONINI <0.30   BNP (last 3 results) No results found for this basename: PROBNP,  in the last 8760 hours CBG:  Recent Labs Lab 08/19/13 0744 08/19/13 1214 08/19/13 1636 08/19/13 2112 08/20/13 0747  GLUCAP 186* 218* 246* 326* 271*    No results found for this or any previous visit (from the past 240 hour(s)).     Studies: Dg Chest 2 View  08/18/2013   CLINICAL DATA:  Dizziness, hypotension, fall today  EXAM: CHEST  2 VIEW  COMPARISON:  06/07/2013  FINDINGS: Linear right lower lobe and left mid lung zone scarring are stable. Evidence of CABG noted with stable rightward position of the inferior most 2 sternal wires as compared to the superior 5 sternal wires. Heart size is mildly enlarged without evidence for edema. No pleural effusion. No acute osseous finding.  IMPRESSION:  Mild cardiomegaly with bibasilar scarring but no acute finding.   Electronically Signed   By: Christiana Pellant M.D.   On: 08/18/2013 12:57   Ct Head Wo Contrast  08/18/2013   ADDENDUM REPORT: 08/18/2013 14:14   Electronically Signed   By: Maryclare Bean M.D.   On: 08/18/2013 14:14   08/18/2013   CLINICAL DATA:  Dizziness  EXAM: CT HEAD WITHOUT CONTRAST  TECHNIQUE: Contiguous axial images were obtained from the base of the skull through the vertex without intravenous contrast.  COMPARISON:  None.  FINDINGS: Global atrophy. Chronic ischemic changes in the periventricular white matter. No mass effect, midline shift, or acute hemorrhage. Inflammatory changes in the sphenoid sinuses. Mastoid air cells are clear.  IMPRESSION: No acute intracranial pathology.  Electronically Signed: By: Maryclare Bean M.D. On: 08/18/2013 13:43   Dg Chest Port 1 View  08/20/2013   CLINICAL DATA:  Cough and shortness of  breath  EXAM: PORTABLE CHEST - 1 VIEW  COMPARISON:  08/19/1948  FINDINGS: Right basilar scarring is again noted but and stable. The cardiac shadow is within normal limits. Postsurgical changes are again seen. No pneumothorax or sizable infiltrate is noted.  IMPRESSION: No acute abnormality is noted.   Electronically Signed   By: Alcide Clever M.D.   On: 08/20/2013 11:05   Dg Chest Port 1 View  08/19/2013   CLINICAL DATA:  Shortness of breath  EXAM: PORTABLE CHEST - 1 VIEW  COMPARISON:  08/18/2013  FINDINGS: Cardiac shadow is stable. Postsurgical changes are again seen. Lungs are well aerated bilaterally. Minimal right basilar scarring is again noted. No focal confluent infiltrate or sizable effusion is noted.  IMPRESSION: Right basilar scarring.  No acute abnormality is noted.   Electronically Signed   By: Alcide Clever M.D.   On: 08/19/2013 08:52        Scheduled Meds: . aspirin  325 mg Oral Daily  . atorvastatin  20 mg Oral Daily  . citalopram  20 mg Oral Daily  . donepezil  10 mg Oral QHS  . heparin  5,000 Units Subcutaneous 3 times per day  . insulin aspart  0-9 Units Subcutaneous TID WC  . insulin glargine  25 Units Subcutaneous Daily  . nitroGLYCERIN      . pantoprazole  40 mg Oral Daily  . sodium chloride  3 mL Intravenous Q12H  . sucralfate  1 g Oral TID WC & HS   Continuous Infusions:    Principal Problem:   Renal failure (ARF), acute on chronic Active Problems:   CAD (coronary artery disease)   S/P CABG (coronary artery bypass graft)   Acute renal failure   Dehydration   Hypotension, unspecified   Diabetes mellitus   Glaucoma   Blindness   Essential hypertension, benign   Other and unspecified hyperlipidemia   Hypokalemia    Time spent: 45 minutes    Amaia Lavallie, MD, FACP, FHM. [facility] Pager [phone]  If 7PM-7AM, please contact night-coverage [email or web address] Password [email or web address] 08/20/2013, 11:11 AM    LOS: 2 days

## 2013-08-20 NOTE — Consult Note (Signed)
Reason for Consult: Chest pain  Requesting Physician: Hongalgi  Cardiologist: None  HPI: This is a 80 y.o. female with a past medical history significant for coronary disease status post previous bypass surgery (roughly 10 years ago), diabetes mellitus, hypertension, history of gouty arthritis admitted with acute renal failure and marked hypotension, presumably secondary to dehydration. Earlier today she complained of chest pain and was very distraught. We're consulted for evaluation of chest pain. I don't think she is having any chest pain right now. She did not have any on admission, with her presenting complaints being dizziness and weakness.  The patient will not really speak to me. She spends most of her time with her eyes closed occasionally moaning. At times she will say "Mercy" or "let me go!". She does not make eye contact. At one point when I was trying to obtain more information about her symptoms, she told me to "shut up". Unfortunately, no family is available. She appears at least confused, possibly encephalopathic.  The results of her workup so far are reviewed. Cardiac enzymes are normal. Her electrocardiogram is nondiagnostic due to left bundle branch block. I was at the bedside while she was undergoing the echocardiogram. Left ventricular ejection fraction and regional wall motion appears to be normal, although the study was difficult due to her inability to cooperate.  PMHx:  Past Medical History  Diagnosis Date  . Diabetes mellitus without complication   . Hypertension   . Coronary artery disease   . Arthritis    Past Surgical History  Procedure Laterality Date  . Coronary artery bypass graft      FAMHx: No family history on file. the patient cannot provide any history at this time.  SOCHx:  reports that she has never smoked. She does not have any smokeless tobacco history on file. She reports that she does not drink alcohol. Her drug history is not on  file.  ALLERGIES: No Known Allergies  ROS: Review of systems not obtained due to patient factors.  HOME MEDICATIONS: Prescriptions prior to admission  Medication Sig Dispense Refill  . ALPRAZolam (XANAX) 0.25 MG tablet Take 0.25 mg by mouth at bedtime as needed for anxiety.      Marland Kitchen. amLODipine (NORVASC) 5 MG tablet Take 5 mg by mouth daily.      Marland Kitchen. atorvastatin (LIPITOR) 20 MG tablet Take 20 mg by mouth daily.      . carvedilol (COREG) 3.125 MG tablet Take 3.125 mg by mouth 2 (two) times daily with a meal.      . citalopram (CELEXA) 20 MG tablet Take 20 mg by mouth 2 (two) times daily.       Marland Kitchen. donepezil (ARICEPT) 10 MG tablet Take 10 mg by mouth at bedtime.      Marland Kitchen. esomeprazole (NEXIUM) 40 MG capsule Take 40 mg by mouth daily at 12 noon.      . furosemide (LASIX) 80 MG tablet Take 80 mg by mouth 2 (two) times daily.      Marland Kitchen. gabapentin (NEURONTIN) 300 MG capsule Take 300 mg by mouth 2 (two) times daily.      Marland Kitchen. ibuprofen (ADVIL,MOTRIN) 200 MG tablet Take 200 mg by mouth every 6 (six) hours as needed (gout).      . insulin NPH-regular Human (NOVOLIN 70/30) (70-30) 100 UNIT/ML injection Inject 40-60 Units into the skin 2 (two) times daily with a meal. 60 units q am, 40 units qpm      . isosorbide mononitrate (  IMDUR) 30 MG 24 hr tablet Take 30 mg by mouth 2 (two) times daily.       . metolazone (ZAROXOLYN) 2.5 MG tablet Take 2.5 mg by mouth daily.      . potassium chloride SA (K-DUR,KLOR-CON) 20 MEQ tablet Take 20 mEq by mouth daily.       . sucralfate (CARAFATE) 1 G tablet Take 1 g by mouth 3 (three) times daily.         HOSPITAL MEDICATIONS: Prior to Admission:  Prescriptions prior to admission  Medication Sig Dispense Refill  . ALPRAZolam (XANAX) 0.25 MG tablet Take 0.25 mg by mouth at bedtime as needed for anxiety.      Marland Kitchen amLODipine (NORVASC) 5 MG tablet Take 5 mg by mouth daily.      Marland Kitchen atorvastatin (LIPITOR) 20 MG tablet Take 20 mg by mouth daily.      . carvedilol (COREG) 3.125 MG  tablet Take 3.125 mg by mouth 2 (two) times daily with a meal.      . citalopram (CELEXA) 20 MG tablet Take 20 mg by mouth 2 (two) times daily.       Marland Kitchen donepezil (ARICEPT) 10 MG tablet Take 10 mg by mouth at bedtime.      Marland Kitchen esomeprazole (NEXIUM) 40 MG capsule Take 40 mg by mouth daily at 12 noon.      . furosemide (LASIX) 80 MG tablet Take 80 mg by mouth 2 (two) times daily.      Marland Kitchen gabapentin (NEURONTIN) 300 MG capsule Take 300 mg by mouth 2 (two) times daily.      Marland Kitchen ibuprofen (ADVIL,MOTRIN) 200 MG tablet Take 200 mg by mouth every 6 (six) hours as needed (gout).      . insulin NPH-regular Human (NOVOLIN 70/30) (70-30) 100 UNIT/ML injection Inject 40-60 Units into the skin 2 (two) times daily with a meal. 60 units q am, 40 units qpm      . isosorbide mononitrate (IMDUR) 30 MG 24 hr tablet Take 30 mg by mouth 2 (two) times daily.       . metolazone (ZAROXOLYN) 2.5 MG tablet Take 2.5 mg by mouth daily.      . potassium chloride SA (K-DUR,KLOR-CON) 20 MEQ tablet Take 20 mEq by mouth daily.       . sucralfate (CARAFATE) 1 G tablet Take 1 g by mouth 3 (three) times daily.        Scheduled: . aspirin  325 mg Oral Daily  . atorvastatin  20 mg Oral Daily  . citalopram  20 mg Oral Daily  . donepezil  10 mg Oral QHS  . heparin  5,000 Units Subcutaneous 3 times per day  . insulin aspart  0-5 Units Subcutaneous QHS  . insulin aspart  0-9 Units Subcutaneous TID WC  . insulin glargine  25 Units Subcutaneous Daily  . nitroGLYCERIN      . pantoprazole  40 mg Oral Daily  . sodium chloride  3 mL Intravenous Q12H  . sucralfate  1 g Oral TID WC & HS    VITALS: Blood pressure 151/72, pulse 94, temperature 98.7 F (37.1 C), temperature source Oral, resp. rate 21, height 5\' 4"  (1.626 m), weight 231 lb 11.3 oz (105.1 kg), SpO2 98.00%.  PHYSICAL EXAM:  General: Alert, oriented x3, no distress Head: no evidence of trauma, PERRL, EOMI, no exophtalmos or lid lag, no myxedema, no xanthelasma; normal ears, nose  and oropharynx Neck: Flat jugular venous pulsations and no hepatojugular reflux; brisk carotid  pulses without delay and no carotid bruits Chest: clear to auscultation, no signs of consolidation by percussion or palpation, normal fremitus, symmetrical and full respiratory excursions Cardiovascular: normal position and quality of the apical impulse, regular rhythm, normal first heart sound and a paradoxically split second heart sound, no rubs or gallops, grade 1-2/6 systolic ejection murmur early peaking Abdomen: no tenderness or distention, no masses by palpation, no abnormal pulsatility or arterial bruits, normal bowel sounds, no hepatosplenomegaly Extremities: no clubbing, cyanosis;  no edema; 2+ radial, ulnar and brachial pulses bilaterally; 2+ right femoral, very weak posterior tibial and dorsalis pedis pulses; 2+ left femoral, very weak posterior tibial and dorsalis pedis pulses; no subclavian or femoral bruits Neurological: grossly nonfocal   LABS  CBC  Recent Labs  08/18/13 1244 08/19/13 0505 08/20/13 0331  WBC 7.3 6.8 6.5  NEUTROABS 4.2  --   --   HGB 12.1 10.2* 11.7*  HCT 35.7* 31.0* 35.8*  MCV 89.0 89.6 91.6  PLT PLATELET CLUMPS NOTED ON SMEAR, COUNT APPEARS ADEQUATE 248 241   Basic Metabolic Panel  Recent Labs  08/18/13 1322 08/19/13 0505 08/20/13 0331 08/20/13 1028  NA  --  137 140  --   K  --  3.4* 4.0  --   CL  --  96 101  --   CO2  --  24 24  --   GLUCOSE  --  213* 258*  --   BUN  --  75* 59*  --   CREATININE  --  4.78* 2.16*  --   CALCIUM  --  7.8* 8.7  --   MG 2.1  --   --  1.7   Liver Function Tests  Recent Labs  08/18/13 1244 08/19/13 0505  AST 13 9  ALT 6 5  ALKPHOS 66 60  BILITOT <0.2* <0.2*  PROT 6.9 5.9*  ALBUMIN 3.2* 2.7*   No results found for this basename: LIPASE, AMYLASE,  in the last 72 hours Cardiac Enzymes  Recent Labs  08/18/13 1322 08/20/13 1031  TROPONINI <0.30 <0.30   BNP No components found with this basename:  POCBNP,  D-Dimer  Recent Labs  08/20/13 1028  DDIMER 1.95*   Hemoglobin A1C  Recent Labs  08/18/13 1848  HGBA1C 8.8*   Fasting Lipid Panel No results found for this basename: CHOL, HDL, LDLCALC, TRIG, CHOLHDL, LDLDIRECT,  in the last 72 hours Thyroid Function Tests  Recent Labs  08/19/13 0505  TSH 0.649      IMAGING: Dg Chest Port 1 View  08/20/2013   CLINICAL DATA:  Cough and shortness of breath  EXAM: PORTABLE CHEST - 1 VIEW  COMPARISON:  08/19/1948  FINDINGS: Right basilar scarring is again noted but and stable. The cardiac shadow is within normal limits. Postsurgical changes are again seen. No pneumothorax or sizable infiltrate is noted.  IMPRESSION: No acute abnormality is noted.   Electronically Signed   By: Alcide Clever M.D.   On: 08/20/2013 11:05   Dg Chest Port 1 View  08/19/2013   CLINICAL DATA:  Shortness of breath  EXAM: PORTABLE CHEST - 1 VIEW  COMPARISON:  08/18/2013  FINDINGS: Cardiac shadow is stable. Postsurgical changes are again seen. Lungs are well aerated bilaterally. Minimal right basilar scarring is again noted. No focal confluent infiltrate or sizable effusion is noted.  IMPRESSION: Right basilar scarring.  No acute abnormality is noted.   Electronically Signed   By: Alcide Clever M.D.   On: 08/19/2013 08:52  ECG: Normal sinus rhythm, left bundle branch  TELEMETRY:  sinus rhythm, artifact  IMPRESSION: 1. There is no objective evidence of active coronary insufficiency by cardiac enzymes and echocardiography. Unfortunately, her ECG is nondiagnostic. I'm not sure whether her complaints of chest pain was a conscious one. I believe she may be encephalopathic. Right now she is moaning nonspecifically and will not answer questions. I'm not sure why her mental status is altered, since her renal function appears to be improving with hydration  RECOMMENDATION: 1. At this point there appears to be no reason for further coronary evaluation. She is not a  candidate for coronary angiography due to acute renal insufficiency. At most, I would recommend a pharmacological myocardial perfusion study (LexiScan Myoview). I think this should wait until her mental status improves. 2. Agree with restarting her carvedilol. She no longer has evidence of bradycardia and is becoming hypertensive. Gradually reintroduce her other cardiac medications. Avoid any medications with psychotropic effects such as her alprazolam. Continue to hold diuretics 3. Negative CT of the head without contrast on admission. Consider reevaluation for stroke. Heart make recommendations without knowing her baseline mental status (note that she was taking medication for dementia and depression before admission).  Time Spent Directly with Patient: 45 minutes  Thurmon Fair, MD, [facility] [phone] office [phone] pager   08/20/2013, 5:01 PM

## 2013-08-20 NOTE — Progress Notes (Signed)
Addendum  Unable to do V/Q scan today due to lack of patient co operation. Radiology unable to perform test adequately until tomorrow. D dimer elevated. Low index of suspicion for PE based on her presentation this morning, resolution of CP with NTG, lack of hypoxia or tachycardia. Patient has been on heparin for DVT prophylaxis- continue same. Re ordered V/Q scan for AM. Unable to reach family to update.   Christy Swanson,Christy Celia, MD, FACP, FHM. [facility] Pager [phone]  If 7PM-7AM, please contact night-coverage [email or web address] Password [email or web address] 08/20/2013, 4:59 PM

## 2013-08-20 NOTE — Progress Notes (Signed)
  Echocardiogram 2D Echocardiogram has been performed.  Leta JunglingCooper, Kathan Kirker M 08/20/2013, 4:15 PM
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
## 2013-08-21 ENCOUNTER — Inpatient Hospital Stay (HOSPITAL_COMMUNITY): Payer: Medicare Other

## 2013-08-21 DIAGNOSIS — I2510 Atherosclerotic heart disease of native coronary artery without angina pectoris: Secondary | ICD-10-CM

## 2013-08-21 DIAGNOSIS — R55 Syncope and collapse: Secondary | ICD-10-CM

## 2013-08-21 LAB — BASIC METABOLIC PANEL
ANION GAP: 14 (ref 5–15)
BUN: 29 mg/dL — ABNORMAL HIGH (ref 6–23)
CALCIUM: 9.1 mg/dL (ref 8.4–10.5)
CHLORIDE: 101 meq/L (ref 96–112)
CO2: 31 mEq/L (ref 19–32)
CREATININE: 1.1 mg/dL (ref 0.50–1.10)
GFR calc Af Amer: 54 mL/min — ABNORMAL LOW (ref >90)
GFR calc non Af Amer: 46 mL/min — ABNORMAL LOW (ref >90)
Glucose, Bld: 208 mg/dL — ABNORMAL HIGH (ref 70–99)
Potassium: 3.5 mEq/L — ABNORMAL LOW (ref 3.7–5.3)
Sodium: 146 mEq/L (ref 137–147)

## 2013-08-21 LAB — GLUCOSE, CAPILLARY
GLUCOSE-CAPILLARY: 175 mg/dL — AB (ref 70–99)
GLUCOSE-CAPILLARY: 230 mg/dL — AB (ref 70–99)
Glucose-Capillary: 221 mg/dL — ABNORMAL HIGH (ref 70–99)
Glucose-Capillary: 278 mg/dL — ABNORMAL HIGH (ref 70–99)
Glucose-Capillary: 173 mg/dL — ABNORMAL HIGH (ref 70–99)

## 2013-08-21 MED ORDER — TECHNETIUM TO 99M ALBUMIN AGGREGATED
6.0000 | Freq: Once | INTRAVENOUS | Status: AC | PRN
  Administered 2013-08-21: 6 via INTRAVENOUS

## 2013-08-21 MED ORDER — TECHNETIUM TC 99M DIETHYLENETRIAME-PENTAACETIC ACID: 40.0000 | Freq: Once | INTRAVENOUS | Status: AC | PRN

## 2013-08-21 MED ORDER — ZOLPIDEM TARTRATE 5 MG PO TABS
5.0000 mg | ORAL_TABLET | Freq: Once | ORAL | Status: AC
  Administered 2013-08-21: 5 mg via ORAL
  Filled 2013-08-21: qty 1

## 2013-08-21 MED ORDER — INSULIN GLARGINE 100 UNIT/ML ~~LOC~~ SOLN
30.0000 [IU] | Freq: Every day | SUBCUTANEOUS | Status: DC
  Administered 2013-08-22: 30 [IU] via SUBCUTANEOUS
  Filled 2013-08-21: qty 0.3

## 2013-08-21 MED ORDER — ISOSORBIDE MONONITRATE ER 30 MG PO TB24
30.0000 mg | ORAL_TABLET | Freq: Two times a day (BID) | ORAL | Status: DC
  Administered 2013-08-21 – 2013-08-22 (×3): 30 mg via ORAL
  Filled 2013-08-21 (×4): qty 1

## 2013-08-21 MED ORDER — INSULIN ASPART 100 UNIT/ML ~~LOC~~ SOLN
3.0000 [IU] | Freq: Three times a day (TID) | SUBCUTANEOUS | Status: DC
  Administered 2013-08-22 (×3): 3 [IU] via SUBCUTANEOUS

## 2013-08-21 MED ORDER — LORAZEPAM 2 MG/ML IJ SOLN: 1.0000 mg | Freq: Once | INTRAMUSCULAR | Status: DC

## 2013-08-21 MED ORDER — CARVEDILOL 3.125 MG PO TABS
3.1250 mg | ORAL_TABLET | Freq: Two times a day (BID) | ORAL | Status: DC
  Administered 2013-08-21 – 2013-08-22 (×4): 3.125 mg via ORAL
  Filled 2013-08-21 (×5): qty 1

## 2013-08-21 NOTE — Clinical Documentation Improvement (Signed)
  Per 8/4 Consult Note "appears at least confused, possibly encephalopathic", uncooperative for VQ scan and per RN Note "confused". If possible, please clarify the nonspecific term "confused" to reflect severity of illness and risk of mortality.. Thank you.  Possible Clinical Conditions?  - Encephalopathy (describe type if known)                       Anoxic                       Septic                       Alcoholic                        Hepatic                       Hypertensive                       Metabolic                       Toxic - Possibly encephalopathic - Other Condition  Thank You, Beverley FiedlerLaurie E Kayman Snuffer ,RN Clinical Documentation Specialist:  [phone]  [facility]- Health Information Management

## 2013-08-21 NOTE — Progress Notes (Signed)
Results for [name], Christy Swanson (MRN [id number]) as of 08/21/2013 11:35  Ref. Range 08/20/2013 20:43 08/20/2013 23:33 08/21/2013 05:34 08/21/2013 07:58 08/21/2013 11:20  Glucose-Capillary Latest Range: 70-99 mg/dL 409198 (H) 811175 (H)  914221 (H) 278 (H)  CBGs greater than 180 mg/dl.  Recommend increasing Lantus to 30 units daily if CBGs continue to be elevated.  Patient does take 70/30 insulin at home.  May want to restart 70/30 before discharge. Will continue to follow. Smith MinceKendra Cordella Nyquist RN BSN CDE

## 2013-08-21 NOTE — Progress Notes (Signed)
*  PRELIMINARY RESULTS* Vascular Ultrasound Carotid Duplex (Doppler) has been completed.  Preliminary findings: Bilateral:  1-39% ICA stenosis.  Vertebral artery flow is antegrade.      Farrel DemarkJill Eunice, RDMS, RVT  08/21/2013, 11:00 AM

## 2013-08-21 NOTE — Progress Notes (Signed)
Pt was not ready to wear CPAP at this time, instructed pt to call when ready to be placed on CPAP

## 2013-08-21 NOTE — Progress Notes (Signed)
PROGRESS NOTE    Christy Swanson ZOX:[id number] DOB: [birth year] DOA: 08/18/2013 PCP: No primary provider on file. PCP & Cardiologist in [place], [place].  HPI/Brief narrative 80 year old female patient with history of type II DM with renal complications, chronic kidney disease and baseline creatinine of 1.3, hypertension, CAD status post CABG, OSA on nightly CPAP and oxygen via nasal cannula at 2 L per minute, blind in both eyes, admitted on 08/18/13 with complaints of dizziness, weakness, lightheadedness-worse in upright position, a fall on 07/18/13 without LOC. She complains of mild intermittent dyspnea and nonproductive cough in the absence of fever or chills. In the ED, she was found to be in acute renal failure with creatinine of 5.7, CT head without acute findings and chest x-ray without acute findings.     Assessment/Plan:   1. Chest pain: Patient did not present with chest pain. On 08/20/13 at approximately 10:15 AM, she apparently became agitated, crying, confused and complained of central chest pain. She briefly passed out for ~ 30 seconds. She did not have apnea and maintained her pulse and blood pressure. EKG-without acute changes and unchanged from prior EKG. Troponins negative. Chest x-ray- no acute findings. Chest pain resolved after sublingual nitroglycerin x2. Echo shows normal EF with grade 1 diastolic dysfunction. VQ scan-low probability for PE. Cardiology consultation and followup appreciated-await further recommendations. Started aspirin. No recurrent chest pain. 2. Brief syncope: Unclear etiology.? Vasovagal versus orthostatic hypotension. Continue monitoring on telemetry- no further episodes of NSVT. 2-D echo with normal EF. Carotid Dopplers: 1-39% bilateral ICA stenosis. 3. NSVT x1: patient had 11 beat NSVT on 8/4. Potassium 4. Magnesium 1.7. Carvedilol held on admission secondary to hypotension and persistent bradycardia in the 40s on 8/3.Resumed carvedilol. No further episodes. 4. ?  Prolonged QTC: Probably cannot interpret QTC correctly in the context of chronic wide QRC.  5. Symptomatic hypotension/HTN: Secondary to dehydration and polypharmacy-diuretics and antihypertensives. She had systolic blood pressures in the 70s on admission. Resolved after IV fluids. Blood pressure starting to rise. Resumed carvedilol and nitrates. 6. Acute on stage III chronic kidney disease: Baseline creatinine may be 1.3. Possibly secondary to dehydration and ATN from hypotension. Creatinine has improved from 5.7-4.7-2.16 >1.10. Improved after IV fluids. DC IVF on 8/4 due to c/o SOB although clinically chest appears clear. Follow BMP in a.m. 7. Type II DM with renal complications: CBCs fluctuating and mildly uncontrolled. Home 70/30 insulin on hold. Continue SSI. Lantus added. A1C: 8.8. Titrate Lantus. Add mealtime NovoLog while hospitalized. Discharge on 70/30 insulin. 8. History of CAD status post CABG: Continue atorvastatin. Held carvedilol secondary to hypotension on admission.? History of CHF-unclear why she was on diuretics-currently on hold. SB better. Resumed Carvedilol which was held. 9. History of hyperlipidemia: Continue atorvastatin 10. Hypokalemia: Replaced 11. OSA on nightly CPAP: Continue nightly CPAP and bedtime oxygen. 12. Anemia: May be chronic and current drop may be dilutional. No reported bleeding. Stable. 13. Asymptomatic sinus bradycardia:? Residual effects of carvedilol in the context of renal failure.  TSH 0.649. Resolved. 14. Altered mental status: Likely acute hospital delirium complicating underlying dementia. Improved. Monitor   Code Status: Full Family Communication: Discussed with daughter [name] at bedside. Disposition Plan: Home possibly 8/6 depending on cardiology input   Consultants:  Cardiology.  Procedures:  None  Antibiotics:  None   Subjective: Mental status much better this morning. Almost back to baseline. As per daughter,  patient had similar acute agitation during previous hospitalization 3-4 months ago. Does have history of  dementia. No chest pain. Objective: Filed Vitals:   08/20/13 2045 08/20/13 2305 08/21/13 0444 08/21/13 1028  BP: 171/79  126/75 134/60  Pulse: 83 75 78 61  Temp: 98.6 F (37 C)  98.8 F (37.1 C) 98.4 F (36.9 C)  TempSrc: Oral  Oral Oral  Resp: 18 15 16 17   Height:      Weight: 105.101 kg (231 lb 11.3 oz)     SpO2: 95%  100% 100%    Intake/Output Summary (Last 24 hours) at 08/21/13 1744 Last data filed at 08/21/13 1400  Gross per 24 hour  Intake    960 ml  Output      0 ml  Net    960 ml   Filed Weights   08/18/13 2134 08/19/13 2109 08/20/13 2045  Weight: 101.1 kg (222 lb 14.2 oz) 105.1 kg (231 lb 11.3 oz) 105.101 kg (231 lb 11.3 oz)     Exam:  General exam: Pleasant elderly obese female sitting propped up in bed. No CP/distress. Respiratory system: Distant breath sounds but clear to auscultation. No increased work of breathing. Cardiovascular system: S1 & S2 heard, RRR. No JVD, murmurs, gallops, clicks or pedal edema. Tele:  Sinus rhythm with BBB morphology. No further NSVT. Gastrointestinal system: Abdomen is nondistended, soft and nontender. Normal bowel sounds heard. Central nervous system: Alert and oriented. No focal neurological deficits. Blindness bilaterally/b/l corneal opacity. Extremities: Symmetric 5 x 5 power.   Data Reviewed: Basic Metabolic Panel:  Recent Labs Lab 08/18/13 1244 08/18/13 1322 08/19/13 0505 08/20/13 0331 08/20/13 1028 08/21/13 0534  NA 137  --  137 140  --  146  K 3.5*  --  3.4* 4.0  --  3.5*  CL 89*  --  96 101  --  101  CO2 24  --  24 24  --  31  GLUCOSE 211*  --  213* 258*  --  208*  BUN 76*  --  75* 59*  --  29*  CREATININE 5.70*  --  4.78* 2.16*  --  1.10  CALCIUM 9.1  --  7.8* 8.7  --  9.1  MG  --  2.1  --   --  1.7  --    Liver Function Tests:  Recent Labs Lab 08/18/13 1244 08/19/13 0505  AST 13 9  ALT 6 5    ALKPHOS 66 60  BILITOT <0.2* <0.2*  PROT 6.9 5.9*  ALBUMIN 3.2* 2.7*   No results found for this basename: LIPASE, AMYLASE,  in the last 168 hours No results found for this basename: AMMONIA,  in the last 168 hours CBC:  Recent Labs Lab 08/18/13 1244 08/19/13 0505 08/20/13 0331  WBC 7.3 6.8 6.5  NEUTROABS 4.2  --   --   HGB 12.1 10.2* 11.7*  HCT 35.7* 31.0* 35.8*  MCV 89.0 89.6 91.6  PLT PLATELET CLUMPS NOTED ON SMEAR, COUNT APPEARS ADEQUATE 248 241   Cardiac Enzymes:  Recent Labs Lab 08/18/13 1322 08/20/13 1031  TROPONINI <0.30 <0.30   BNP (last 3 results)  Recent Labs  08/20/13 1031  PROBNP 1444.0*   CBG:  Recent Labs Lab 08/20/13 2043 08/20/13 2333 08/21/13 0758 08/21/13 1120 08/21/13 1723  GLUCAP 198* 175* 221* 278* 230*    No results found for this or any previous visit (from the past 240 hour(s)).     Studies: Nm Pulmonary Perf And Vent  08/21/2013   CLINICAL DATA:  Shortness of breath  EXAM: NUCLEAR MEDICINE VENTILATION - PERFUSION  LUNG SCAN  TECHNIQUE: Ventilation images were obtained in multiple projections using inhaled aerosol technetium 99 M DTPA. Perfusion images were obtained in multiple projections after intravenous injection of Tc-58m MAA.  RADIOPHARMACEUTICALS:  40 mCi Tc-23m DTPA aerosol and Tc-63m MAA  COMPARISON:  Chest radiograph 08/20/2013  FINDINGS: Ventilation: There is apparent focally concentrated radiotracer uptake at the ventilation images in the trachea and at the hila, likely due to artifact from aerosolization.  Perfusion: No wedge shaped peripheral perfusion defects to suggest acute pulmonary embolism.  IMPRESSION: Likely technical suboptimal aerosolization on ventilatory images, without corresponding per fusion defect. Low probability for pulmonary embolism.   Electronically Signed   By: Christiana Pellant M.D.   On: 08/21/2013 17:29   Dg Chest Port 1 View  08/20/2013   CLINICAL DATA:  Cough and shortness of breath  EXAM:  PORTABLE CHEST - 1 VIEW  COMPARISON:  08/19/1948  FINDINGS: Right basilar scarring is again noted but and stable. The cardiac shadow is within normal limits. Postsurgical changes are again seen. No pneumothorax or sizable infiltrate is noted.  IMPRESSION: No acute abnormality is noted.   Electronically Signed   By: Alcide Clever M.D.   On: 08/20/2013 11:05        Scheduled Meds: . aspirin  325 mg Oral Daily  . atorvastatin  20 mg Oral Daily  . carvedilol  3.125 mg Oral BID WC  . citalopram  20 mg Oral Daily  . donepezil  10 mg Oral QHS  . heparin  5,000 Units Subcutaneous 3 times per day  . insulin aspart  0-5 Units Subcutaneous QHS  . insulin aspart  0-9 Units Subcutaneous TID WC  . insulin glargine  25 Units Subcutaneous Daily  . isosorbide mononitrate  30 mg Oral BID  . LORazepam  1 mg Intravenous Once  . pantoprazole  40 mg Oral Daily  . sodium chloride  3 mL Intravenous Q12H  . sucralfate  1 g Oral TID WC & HS   Continuous Infusions:    Principal Problem:   Renal failure (ARF), acute on chronic Active Problems:   CAD (coronary artery disease)   S/P CABG (coronary artery bypass graft)   Acute renal failure   Dehydration   Hypotension, unspecified   Diabetes mellitus   Glaucoma   Blindness   Essential hypertension, benign   Other and unspecified hyperlipidemia   Hypokalemia   Chest pain, unspecified   Syncope   Prolonged Q-T interval on ECG   NSVT (nonsustained ventricular tachycardia)    Time spent: 45 minutes    Marleena Shubert, MD, FACP, FHM. [facility] Pager [phone]  If 7PM-7AM, please contact night-coverage [email or web address] Password [email or web address] 08/21/2013, 5:44 PM    LOS: 3 days

## 2013-08-21 NOTE — Progress Notes (Signed)
Subjective:  No recurrent chest pain  Objective:   Vital Signs in the last 24 hours: Temp:  [98.4 F (36.9 C)-98.8 F (37.1 C)] 98.4 F (36.9 C) (08/05 1028) Pulse Rate:  [61-83] 61 (08/05 1028) Resp:  [15-18] 17 (08/05 1028) BP: (126-171)/(60-79) 134/60 mmHg (08/05 1028) SpO2:  [95 %-100 %] 100 % (08/05 1028) Weight:  [231 lb 11.3 oz (105.101 kg)] 231 lb 11.3 oz (105.101 kg) (08/04 2045)  Intake/Output from previous day: 08/04 0701 - 08/05 0700 In: 240 [P.O.:240] Out: 860 [Urine:860]  Medications: . aspirin  325 mg Oral Daily  . atorvastatin  20 mg Oral Daily  . carvedilol  3.125 mg Oral BID WC  . citalopram  20 mg Oral Daily  . donepezil  10 mg Oral QHS  . heparin  5,000 Units Subcutaneous 3 times per day  . insulin aspart  0-5 Units Subcutaneous QHS  . insulin aspart  0-9 Units Subcutaneous TID WC  . insulin glargine  25 Units Subcutaneous Daily  . isosorbide mononitrate  30 mg Oral BID  . pantoprazole  40 mg Oral Daily  . sodium chloride  3 mL Intravenous Q12H  . sucralfate  1 g Oral TID WC & HS       Physical Exam:   General appearance: alert and no distress Neck: no adenopathy, no JVD, supple, symmetrical, trachea midline, thyroid not enlarged, symmetric, no tenderness/mass/nodules and thick neck Lungs: clear to auscultation bilaterally Heart: regularly irregular rhythm and 1-2/6 sem Abdomen: soft, non-tender; bowel sounds normal; no masses,  no organomegaly Extremities: no edema, redness or tenderness in the calves or thighs Neurologic: Grossly normal   Rate: 70's  Rhythm: sinus with PAC's  Lab Results:   Recent Labs  08/20/13 0331 08/21/13 0534  NA 140 146  K 4.0 3.5*  CL 101 101  CO2 24 31  GLUCOSE 258* 208*  BUN 59* 29*  CREATININE 2.16* 1.10    Cr improved from 4.78 on 08/19/13  Recent Labs  08/18/13 1322 08/20/13 1031  TROPONINI <0.30 <0.30    Hepatic Function Panel  Recent Labs  08/19/13 0505  PROT 5.9*  ALBUMIN 2.7*    AST 9  ALT 5  ALKPHOS 60  BILITOT <0.2*    Recent Labs  08/18/13 1322  INR 1.03   BNP (last 3 results)  Recent Labs  08/20/13 1031  PROBNP 1444.0*    Lipid Panel  No results found for this basename: chol, trig, hdl, cholhdl, vldl, ldlcalc      Imaging:  Dg Chest Port 1 View  08/20/2013   CLINICAL DATA:  Cough and shortness of breath  EXAM: PORTABLE CHEST - 1 VIEW  COMPARISON:  08/19/1948  FINDINGS: Right basilar scarring is again noted but and stable. The cardiac shadow is within normal limits. Postsurgical changes are again seen. No pneumothorax or sizable infiltrate is noted.  IMPRESSION: No acute abnormality is noted.   Electronically Signed   By: Alcide Clever M.D.   On: 08/20/2013 11:05      Assessment/Plan:   Principal Problem:   Renal failure (ARF), acute on chronic Active Problems:   CAD (coronary artery disease)   S/P CABG (coronary artery bypass graft)   Acute renal failure   Dehydration   Hypotension, unspecified   Diabetes mellitus   Glaucoma   Blindness   Essential hypertension, benign   Other and unspecified hyperlipidemia   Hypokalemia   Chest pain, unspecified   Syncope   Prolonged Q-T interval on  ECG   NSVT (nonsustained ventricular tachycardia)   Troponins negative. Sinus rhythm with PAC's, now on carvedilol 3.125 mg bid; consider increasing to 6.25 bid tomorrow depending on HR and BP. Cr much better at 1.1 from 4.78 with hydration. K replete to 4.0. F/U ECG.   Lennette Biharihomas A. Jadalynn Burr, MD, [facility] CountyFACC 08/21/2013, 11:41 AM

## 2013-08-21 NOTE — Progress Notes (Addendum)
PT Cancellation Note  Patient Details Name: Christy Swanson MRN: [id number] DOB: 27-Aug-1931   Cancelled Treatment:    Reason Eval/Treat Not Completed: Patient declined, no reason specified (pt had just got up to go to bathroom and is too tired to get up again, also c/o gout pain in elbow, RN notified).  Will follow.    Ralene BatheUhlenberg, Talin Feister Kistler 08/21/2013, 12:27 PM [phone]

## 2013-08-22 DIAGNOSIS — I209 Angina pectoris, unspecified: Secondary | ICD-10-CM

## 2013-08-22 LAB — BASIC METABOLIC PANEL
ANION GAP: 13 (ref 5–15)
BUN: 24 mg/dL — ABNORMAL HIGH (ref 6–23)
CALCIUM: 8.7 mg/dL (ref 8.4–10.5)
CHLORIDE: 97 meq/L (ref 96–112)
CO2: 31 mEq/L (ref 19–32)
Creatinine, Ser: 1.11 mg/dL — ABNORMAL HIGH (ref 0.50–1.10)
GFR calc Af Amer: 53 mL/min — ABNORMAL LOW (ref >90)
GFR, EST NON AFRICAN AMERICAN: 46 mL/min — AB (ref >90)
Glucose, Bld: 145 mg/dL — ABNORMAL HIGH (ref 70–99)
Potassium: 3.2 mEq/L — ABNORMAL LOW (ref 3.7–5.3)
Sodium: 141 mEq/L (ref 137–147)

## 2013-08-22 LAB — GLUCOSE, CAPILLARY
Glucose-Capillary: 133 mg/dL — ABNORMAL HIGH (ref 70–99)
Glucose-Capillary: 230 mg/dL — ABNORMAL HIGH (ref 70–99)
Glucose-Capillary: 136 mg/dL — ABNORMAL HIGH (ref 70–99)

## 2013-08-22 MED ORDER — ASPIRIN EC 81 MG PO TBEC: 81.0000 mg | DELAYED_RELEASE_TABLET | Freq: Every day | ORAL | Status: AC

## 2013-08-22 MED ORDER — POTASSIUM CHLORIDE CRYS ER 20 MEQ PO TBCR
40.0000 meq | EXTENDED_RELEASE_TABLET | Freq: Once | ORAL | Status: AC
  Administered 2013-08-22: 40 meq via ORAL
  Filled 2013-08-22: qty 2

## 2013-08-22 MED ORDER — CITALOPRAM HYDROBROMIDE 20 MG PO TABS: 20.0000 mg | ORAL_TABLET | Freq: Every day | ORAL | Status: AC

## 2013-08-22 MED ORDER — FUROSEMIDE 80 MG PO TABS: 40.0000 mg | ORAL_TABLET | Freq: Every day | ORAL | Status: DC

## 2013-08-22 MED ORDER — NITROGLYCERIN 0.4 MG SL SUBL: 0.4000 mg | SUBLINGUAL_TABLET | SUBLINGUAL | Status: AC | PRN

## 2013-08-22 MED ORDER — INSULIN NPH ISOPHANE & REGULAR (70-30) 100 UNIT/ML ~~LOC~~ SUSP: 40.0000 [IU] | Freq: Two times a day (BID) | SUBCUTANEOUS | Status: DC

## 2013-08-22 NOTE — Discharge Summary (Addendum)
Physician Discharge Summary  Christy Swanson ZOX:[id number] DOB: 1931-12-29 DOA: 08/18/2013  PCP: Eric Form, MD  Admit date: 08/18/2013 Discharge date: 08/22/2013  Time spent: Greater than 30 minutes  Recommendations for Outpatient Follow-up:  1. Dr. Eric Form, PCP in 4 days with repeat labs (CBC & BMP). 2. Primary Cardiologist in [place], [place]- ? Dr. Beckie Salts, in 1 week. 3. Home Health PT & RN.  Discharge Diagnoses:  Principal Problem:   Renal failure (ARF), acute on chronic Active Problems:   CAD (coronary artery disease)   S/P CABG (coronary artery bypass graft)   Acute renal failure   Dehydration   Hypotension, unspecified   Diabetes mellitus   Glaucoma   Blindness   Essential hypertension, benign   Other and unspecified hyperlipidemia   Hypokalemia   Chest pain, unspecified   Syncope   Prolonged Q-T interval on ECG   NSVT (nonsustained ventricular tachycardia)   Discharge Condition: Improved & Stable  Diet recommendation: Heart healthy and diabetic diet.  Filed Weights   08/19/13 2109 08/20/13 2045 08/21/13 2119  Weight: 105.1 kg (231 lb 11.3 oz) 105.101 kg (231 lb 11.3 oz) 105.101 kg (231 lb 11.3 oz)   History of presenting illness: 80 year old female patient with history of type II DM with renal complications, chronic kidney disease and baseline creatinine of 1.3, hypertension, CAD status post CABG, OSA on nightly CPAP and oxygen via nasal cannula at 2 L per minute, blind in both eyes, admitted on 08/18/13 with complaints of dizziness, weakness, lightheadedness-worse in upright position, a fall on 07/18/13 without LOC. She complains of mild intermittent dyspnea and nonproductive cough in the absence of fever or chills. In the ED, she was found to be in acute renal failure with creatinine of 5.7, CT head without acute findings and chest x-ray without acute findings.  Hospital course:  1. Chest pain: Patient did not present with chest pain. On 08/20/13 at approximately  10:15 AM, she apparently became agitated, crying, confused and complained of central chest pain. She briefly passed out for ~ 30 seconds. She did not have apnea and maintained her pulse and blood pressure. EKG-without acute changes and unchanged from prior EKG. Troponins negative. Chest x-ray- no acute findings. Chest pain resolved after sublingual nitroglycerin x2. Echo shows normal EF with grade 1 diastolic dysfunction. VQ scan-low probability for PE. Cardiology consultation and followup appreciated-discussed with Dr. Tresa Endo, no need for cath or inpatient nuclear study at this time. May consider ischemic workup as outpatient if she has recurrent symptoms or chest discomfort. Started aspirin. No recurrent chest pain. Discussed with patient's daughter Ms. Rinaldo Cloud who verbalized understanding. 2. Brief syncope: Unclear etiology.? Vasovagal versus orthostatic hypotension. Mostly remained in sinus bradycardia in the 50s to sinus rhythm in the 60s with a solitary episode of NSVT. 2-D echo with normal EF. Carotid Dopplers: 1-39% bilateral ICA stenosis. 3. NSVT x1: patient had 11 beat NSVT on 8/4. Potassium 4. Magnesium 1.7. Carvedilol held on admission secondary to hypotension and persistent bradycardia in the 40s on 8/3.Resumed carvedilol. No further episodes. 4. ? Prolonged QTC: Probably cannot interpret QTC correctly in the context of chronic wide QRC.  5. Symptomatic hypotension/HTN: Secondary to dehydration and polypharmacy-diuretics and antihypertensives. She had systolic blood pressures in the 70s on admission. Resolved after IV fluids. Blood pressure starting to rise. Resumed carvedilol and nitrates. We'll resume amlodipine at discharge. 6. Acute on stage III chronic kidney disease: Baseline creatinine may be 1.3. AKI secondary to dehydration and ATN from hypotension. Creatinine has improved from  5.7-4.7-2.16 >1.11. Resolved after IV fluids. DC IVF on 8/4 due to c/o SOB although clinically chest appears clear.  Discontinued NSAIDs. 7. Type II DM with renal complications: CBG's fluctuating . 70/30 insulin on hold in hospital. Treated with SSI & Lantus A1C: 8.8. Discharge on 70/30 insulin. Poor outpatient control-outpatient followup regarding further management. 8. History of CAD status post CABG: Continue atorvastatin. Held carvedilol secondary to hypotension on admission.? History of CHF-unclear why she was on diuretics-held in hospital. SB better. Resumed Carvedilol which was held. 9. History of hyperlipidemia: Continue atorvastatin 10. Hypokalemia: Replaced 11. OSA on nightly CPAP: Continue nightly CPAP and bedtime oxygen. 12. Anemia: May be chronic and current drop may be dilutional. No reported bleeding. Stable. 13. Asymptomatic sinus bradycardia:? Residual effects of carvedilol in the context of renal failure. TSH 0.649. Heart rates range in the 50s-60s. Leave carvedilol dose as above. 14. Altered mental status: Likely acute hospital delirium complicating underlying dementia. Resolved. 15. ? Chronic diastolic CHF: Diuretics had been held in the hospital secondary to acute renal failure. Will resume Lasix at a reduced dose and hold Zaroxolyn until outpatient followup with her PCP and cardiologist. Stable.   Consultations:  Cardiology  Procedures:  None    Discharge Exam:  Complaints:  Patient denies complaints. Denies chest pain or dyspnea. Has minimal dry cough. As per daughter, mental status is back to baseline.  Filed Vitals:   08/21/13 2119 08/22/13 0508 08/22/13 0518 08/22/13 1026  BP: 116/72  118/74 146/72  Pulse: 55  61 67  Temp: 98.9 F (37.2 C)  98 F (36.7 C) 98 F (36.7 C)  TempSrc:   Oral Oral  Resp: 16  16 18   Height:      Weight: 105.101 kg (231 lb 11.3 oz)     SpO2: 99% 99% 97% 91%    General exam: Pleasant elderly obese female sitting up in chair comfortably. No CP/distress.  Respiratory system: clear to auscultation except occasional basal crackles. No  increased work of breathing.  Cardiovascular system: S1 & S2 heard, RRR. No JVD, murmurs, gallops, clicks or pedal edema. Tele: Sinus rhythm with BBB morphology-sinus bradycardia in the 50s. No further NSVT.  Gastrointestinal system: Abdomen is nondistended, soft and nontender. Normal bowel sounds heard.  Central nervous system: Alert and oriented. No focal neurological deficits. Blindness bilaterally/b/l corneal opacity.  Extremities: Symmetric 5 x 5 power.  Discharge Instructions      Discharge Instructions   (HEART FAILURE PATIENTS) Call MD:  Anytime you have any of the following symptoms: 1) 3 pound weight gain in 24 hours or 5 pounds in 1 week 2) shortness of breath, with or without a dry hacking cough 3) swelling in the hands, feet or stomach 4) if you have to sleep on extra pillows at night in order to breathe.    Complete by:  As directed      Call MD for:  difficulty breathing, headache or visual disturbances    Complete by:  As directed      Call MD for:  extreme fatigue    Complete by:  As directed      Call MD for:  persistant dizziness or light-headedness    Complete by:  As directed      Call MD for:  severe uncontrolled pain    Complete by:  As directed      Diet - low sodium heart healthy    Complete by:  As directed      Increase activity slowly  Complete by:  As directed             Medication List    STOP taking these medications       ibuprofen 200 MG tablet  Commonly known as:  ADVIL,MOTRIN     metolazone 2.5 MG tablet  Commonly known as:  ZAROXOLYN      TAKE these medications       ALPRAZolam 0.25 MG tablet  Commonly known as:  XANAX  Take 0.25 mg by mouth at bedtime as needed for anxiety.     amLODipine 5 MG tablet  Commonly known as:  NORVASC  Take 5 mg by mouth daily.     aspirin EC 81 MG tablet  Take 1 tablet (81 mg total) by mouth daily.     atorvastatin 20 MG tablet  Commonly known as:  LIPITOR  Take 20 mg by mouth daily.      carvedilol 3.125 MG tablet  Commonly known as:  COREG  Take 3.125 mg by mouth 2 (two) times daily with a meal.     citalopram 20 MG tablet  Commonly known as:  CELEXA  Take 1 tablet (20 mg total) by mouth daily.     donepezil 10 MG tablet  Commonly known as:  ARICEPT  Take 10 mg by mouth at bedtime.     esomeprazole 40 MG capsule  Commonly known as:  NEXIUM  Take 40 mg by mouth daily at 12 noon.     furosemide 80 MG tablet  Commonly known as:  LASIX  Take 0.5 tablets (40 mg total) by mouth daily.     gabapentin 300 MG capsule  Commonly known as:  NEURONTIN  Take 300 mg by mouth 2 (two) times daily.     insulin NPH-regular Human (70-30) 100 UNIT/ML injection  Commonly known as:  NOVOLIN 70/30  Inject 40-60 Units into the skin 2 (two) times daily with a meal. 60 units q am, 40 units qpm  Start taking on:  08/23/2013     isosorbide mononitrate 30 MG 24 hr tablet  Commonly known as:  IMDUR  Take 30 mg by mouth 2 (two) times daily.     nitroGLYCERIN 0.4 MG SL tablet  Commonly known as:  NITROSTAT  Place 1 tablet (0.4 mg total) under the tongue every 5 (five) minutes as needed for chest pain (upto 3 doses.).     potassium chloride SA 20 MEQ tablet  Commonly known as:  K-DUR,KLOR-CON  Take 20 mEq by mouth daily.     sucralfate 1 G tablet  Commonly known as:  CARAFATE  Take 1 g by mouth 3 (three) times daily.       Follow-up Information   Follow up with [facility], [place]. Schedule an appointment as soon as possible for a visit in 4 days. (To be seen with repeat labs (CBC & BMP))    Specialty:  Family Medicine   Contact information:   [address] [place] [address] [phone]       Follow up with Primary Cardiologist in [place], [place]. Schedule an appointment as soon as possible for a visit in 1 week.       The results of significant diagnostics from this hospitalization (including imaging, microbiology, ancillary and laboratory) are listed below for reference.     Significant Diagnostic Studies: Dg Chest 2 View  08/18/2013   CLINICAL DATA:  Dizziness, hypotension, fall today  EXAM: CHEST  2 VIEW  COMPARISON:  06/07/2013  FINDINGS: Linear right  lower lobe and left mid lung zone scarring are stable. Evidence of CABG noted with stable rightward position of the inferior most 2 sternal wires as compared to the superior 5 sternal wires. Heart size is mildly enlarged without evidence for edema. No pleural effusion. No acute osseous finding.  IMPRESSION: Mild cardiomegaly with bibasilar scarring but no acute finding.   Electronically Signed   By: Christiana Pellant M.D.   On: 08/18/2013 12:57   Ct Head Wo Contrast  08/18/2013   ADDENDUM REPORT: 08/18/2013 14:14   Electronically Signed   By: Maryclare Bean M.D.   On: 08/18/2013 14:14   08/18/2013   CLINICAL DATA:  Dizziness  EXAM: CT HEAD WITHOUT CONTRAST  TECHNIQUE: Contiguous axial images were obtained from the base of the skull through the vertex without intravenous contrast.  COMPARISON:  None.  FINDINGS: Global atrophy. Chronic ischemic changes in the periventricular white matter. No mass effect, midline shift, or acute hemorrhage. Inflammatory changes in the sphenoid sinuses. Mastoid air cells are clear.  IMPRESSION: No acute intracranial pathology.  Electronically Signed: By: Maryclare Bean M.D. On: 08/18/2013 13:43   Nm Pulmonary Perf And Vent  08/21/2013   CLINICAL DATA:  Shortness of breath  EXAM: NUCLEAR MEDICINE VENTILATION - PERFUSION LUNG SCAN  TECHNIQUE: Ventilation images were obtained in multiple projections using inhaled aerosol technetium 99 M DTPA. Perfusion images were obtained in multiple projections after intravenous injection of Tc-41m MAA.  RADIOPHARMACEUTICALS:  40 mCi Tc-75m DTPA aerosol and Tc-15m MAA  COMPARISON:  Chest radiograph 08/20/2013  FINDINGS: Ventilation: There is apparent focally concentrated radiotracer uptake at the ventilation images in the trachea and at the hila, likely due to artifact  from aerosolization.  Perfusion: No wedge shaped peripheral perfusion defects to suggest acute pulmonary embolism.  IMPRESSION: Likely technical suboptimal aerosolization on ventilatory images, without corresponding per fusion defect. Low probability for pulmonary embolism.   Electronically Signed   By: Christiana Pellant M.D.   On: 08/21/2013 17:29   Dg Chest Port 1 View  08/20/2013   CLINICAL DATA:  Cough and shortness of breath  EXAM: PORTABLE CHEST - 1 VIEW  COMPARISON:  08/19/1948  FINDINGS: Right basilar scarring is again noted but and stable. The cardiac shadow is within normal limits. Postsurgical changes are again seen. No pneumothorax or sizable infiltrate is noted.  IMPRESSION: No acute abnormality is noted.   Electronically Signed   By: Alcide Clever M.D.   On: 08/20/2013 11:05   Dg Chest Port 1 View  08/19/2013   CLINICAL DATA:  Shortness of breath  EXAM: PORTABLE CHEST - 1 VIEW  COMPARISON:  08/18/2013  FINDINGS: Cardiac shadow is stable. Postsurgical changes are again seen. Lungs are well aerated bilaterally. Minimal right basilar scarring is again noted. No focal confluent infiltrate or sizable effusion is noted.  IMPRESSION: Right basilar scarring.  No acute abnormality is noted.   Electronically Signed   By: Alcide Clever M.D.   On: 08/19/2013 08:52    Microbiology: No results found for this or any previous visit (from the past 240 hour(s)).   Labs: Basic Metabolic Panel:  Recent Labs Lab 08/18/13 1244 08/18/13 1322 08/19/13 0505 08/20/13 0331 08/20/13 1028 08/21/13 0534 08/22/13 0555  NA 137  --  137 140  --  146 141  K 3.5*  --  3.4* 4.0  --  3.5* 3.2*  CL 89*  --  96 101  --  101 97  CO2 24  --  24 24  --  31 31  GLUCOSE 211*  --  213* 258*  --  208* 145*  BUN 76*  --  75* 59*  --  29* 24*  CREATININE 5.70*  --  4.78* 2.16*  --  1.10 1.11*  CALCIUM 9.1  --  7.8* 8.7  --  9.1 8.7  MG  --  2.1  --   --  1.7  --   --    Liver Function Tests:  Recent Labs Lab  08/18/13 1244 08/19/13 0505  AST 13 9  ALT 6 5  ALKPHOS 66 60  BILITOT <0.2* <0.2*  PROT 6.9 5.9*  ALBUMIN 3.2* 2.7*   No results found for this basename: LIPASE, AMYLASE,  in the last 168 hours No results found for this basename: AMMONIA,  in the last 168 hours CBC:  Recent Labs Lab 08/18/13 1244 08/19/13 0505 08/20/13 0331  WBC 7.3 6.8 6.5  NEUTROABS 4.2  --   --   HGB 12.1 10.2* 11.7*  HCT 35.7* 31.0* 35.8*  MCV 89.0 89.6 91.6  PLT PLATELET CLUMPS NOTED ON SMEAR, COUNT APPEARS ADEQUATE 248 241   Cardiac Enzymes:  Recent Labs Lab 08/18/13 1322 08/20/13 1031  TROPONINI <0.30 <0.30   BNP: BNP (last 3 results)  Recent Labs  08/20/13 1031  PROBNP 1444.0*   CBG:  Recent Labs Lab 08/21/13 1120 08/21/13 1723 08/21/13 2123 08/22/13 0756 08/22/13 1125  GLUCAP 278* 230* 173* 133* 230*     Signed:  Marcellus Scott, MD, FACP, FHM. [facility] Pager [phone]  If 7PM-7AM, please contact night-coverage [email or web address] Password [facility] 08/22/2013, 3:32 PM

## 2013-08-22 NOTE — Progress Notes (Signed)
Heart Failure Booklet and information given to patient's daughter-Pam.Nurse educated both patient and daughter about the five cardinal symptoms of heart failure,its preventions and when to call doctors.Questions asked answered and clarified by the nurse.

## 2013-08-22 NOTE — Progress Notes (Addendum)
Subjective:  Feels better today; No recurrent chest pain since presentation  Objective:   Vital Signs in the last 24 hours: Temp:  [98 F (36.7 C)-99 F (37.2 C)] 98 F (36.7 C) (08/06 1026) Pulse Rate:  [55-67] 67 (08/06 1026) Resp:  [16-18] 18 (08/06 1026) BP: (116-146)/(70-74) 146/72 mmHg (08/06 1026) SpO2:  [91 %-100 %] 91 % (08/06 1026) Weight:  [231 lb 11.3 oz (105.101 kg)] 231 lb 11.3 oz (105.101 kg) (08/05 2119)  Intake/Output from previous day: 08/05 0701 - 08/06 0700 In: 1320 [P.O.:1320] Out: -   Medications: . aspirin  325 mg Oral Daily  . atorvastatin  20 mg Oral Daily  . carvedilol  3.125 mg Oral BID WC  . citalopram  20 mg Oral Daily  . donepezil  10 mg Oral QHS  . heparin  5,000 Units Subcutaneous 3 times per day  . insulin aspart  0-5 Units Subcutaneous QHS  . insulin aspart  0-9 Units Subcutaneous TID WC  . insulin aspart  3 Units Subcutaneous TID WC  . insulin glargine  30 Units Subcutaneous Daily  . isosorbide mononitrate  30 mg Oral BID  . LORazepam  1 mg Intravenous Once  . pantoprazole  40 mg Oral Daily  . sodium chloride  3 mL Intravenous Q12H  . sucralfate  1 g Oral TID WC & HS       Physical Exam:   General appearance: alert and no distress Neck: no adenopathy, no JVD, supple, symmetrical, trachea midline, thyroid not enlarged, symmetric, no tenderness/mass/nodules and thick neck Lungs: clear to auscultation bilaterally Heart: regularly irregular rhythm and 1-2/6 sem Abdomen: soft, non-tender; bowel sounds normal; no masses,  no organomegaly Extremities: no edema, redness or tenderness in the calves or thighs Neurologic: Grossly normal   Rate: 72  Rhythm: NSR  Lab Results:   Recent Labs  08/21/13 0534 08/22/13 0555  NA 146 141  K 3.5* 3.2*  CL 101 97  CO2 31 31  GLUCOSE 208* 145*  BUN 29* 24*  CREATININE 1.10 1.11*    Cr improved from 4.78 on 08/19/13  Recent Labs  08/20/13 1031  TROPONINI <0.30    Hepatic  Function Panel No results found for this basename: PROT, ALBUMIN, AST, ALT, ALKPHOS, BILITOT, BILIDIR, IBILI,  in the last 72 hours No results found for this basename: INR,  in the last 72 hours BNP (last 3 results)  Recent Labs  08/20/13 1031  PROBNP 1444.0*    Lipid Panel  No results found for this basename: chol,  trig,  hdl,  cholhdl,  vldl,  ldlcalc      Imaging:  Nm Pulmonary Perf And Vent  08/21/2013   CLINICAL DATA:  Shortness of breath  EXAM: NUCLEAR MEDICINE VENTILATION - PERFUSION LUNG SCAN  TECHNIQUE: Ventilation images were obtained in multiple projections using inhaled aerosol technetium 99 M DTPA. Perfusion images were obtained in multiple projections after intravenous injection of Tc-4735m MAA.  RADIOPHARMACEUTICALS:  40 mCi Tc-1535m DTPA aerosol and 6mCi Tc-6435m MAA  COMPARISON:  Chest radiograph 08/20/2013  FINDINGS: Ventilation: There is apparent focally concentrated radiotracer uptake at the ventilation images in the trachea and at the hila, likely due to artifact from aerosolization.  Perfusion: No wedge shaped peripheral perfusion defects to suggest acute pulmonary embolism.  IMPRESSION: Likely technical suboptimal aerosolization on ventilatory images, without corresponding per fusion defect. Low probability for pulmonary embolism.   Electronically Signed   By: Christiana PellantGretchen  Green M.D.   On: 08/21/2013 17:29  Assessment/Plan:   Principal Problem:   Renal failure (ARF), acute on chronic Active Problems:   CAD (coronary artery disease)   S/P CABG (coronary artery bypass graft)   Acute renal failure   Dehydration   Hypotension, unspecified   Diabetes mellitus   Glaucoma   Blindness   Essential hypertension, benign   Other and unspecified hyperlipidemia   Hypokalemia   Chest pain, unspecified   Syncope   Prolonged Q-T interval on ECG   NSVT (nonsustained ventricular tachycardia)   Troponins negative. Sinus rhythm. Cr remains stable, now 1.11 improved from  4.78. Consider titrating carvedilol to 6.25 mg bid with BP>140. No need for cath or in-patient nuclear study presently. Consider as outpatient if recurrent symptoms of chest discomfort.   Lennette Bihari, MD, [facility] 08/22/2013, 2:41 PM

## 2013-08-22 NOTE — Care Management Note (Signed)
CARE MANAGEMENT NOTE 08/22/2013  Patient:  TEKIA, [place]   Account Number:  [id number]  Date Initiated:  08/22/2013  Documentation initiated by:  Trinton Prewitt  Subjective/Objective Assessment:   referral for d/c planning.     Action/Plan:   Met with pt , who lives in [place], [place] and is here visiting her daughter when she became ill. Plan to d/c to daughters home and return to [place] , [place] when stronger. Has PCP in [place]. IM given   Anticipated DC Date:  08/22/2013   Anticipated DC Plan:  [place]         [facility] Choice  HOME HEALTH   Choice offered to / List presented to:  C-1 Patient        [place] arranged  HH-1 RN  HH-2 PT      Status of service:  Completed, signed off Medicare Important Message given?  YES (If response is "NO", the following Medicare IM given date fields will be blank) Date Medicare IM given:  08/22/2013 Medicare IM given by:  Eldene Plocher Date Additional Medicare IM given:   Additional Medicare IM given by:    Discharge Disposition:  [place]  Per UR Regulation:    If discussed at Long Length of Stay Meetings, dates discussed:    Comments:  08/22/2013 Pt active with [facility] for [facility] aide ( CAP) services, this CM contacted that agency to resume services and faxed D/C summary per their request to [phone] [facility] and HHPT to be provided by [facility], spoke to that agency at [phone] H&P, D/C Summary, [place] orders and face to face faxed to that agency @ [phone]. Followup will be made @ [phone]. Pt and daughter informed of plan and info faxed. Jasmine Pang RN MPH, case manager, [phone]

## 2013-08-22 NOTE — Care Management Note (Signed)
CARE MANAGEMENT NOTE 08/22/2013  Patient:  Christy Swanson, Christy Swanson   Account Number:  [id number]  Date Initiated:  08/22/2013  Documentation initiated by:  Koven Belinsky  Subjective/Objective Assessment:   referral for d/c planning.     Action/Plan:   Met with pt , who lives in [place], [place] and is here visiting her daughter when she became ill. Plan to d/c to daughters home and return to [place] , [place] when stronger. Has PCP in [place]. IM given   Anticipated DC Date:  08/22/2013   Anticipated DC Plan:  HOME/SELF CARE         Choice offered to / List presented to:             Status of service:  Completed, signed off Medicare Important Message given?  YES (If response is "NO", the following Medicare IM given date fields will be blank) Date Medicare IM given:  08/22/2013 Medicare IM given by:  Lean Jaeger Date Additional Medicare IM given:   Additional Medicare IM given by:    Discharge Disposition:    Per UR Regulation:    If discussed at Long Length of Stay Meetings, dates discussed:    Comments:

## 2013-08-22 NOTE — Progress Notes (Signed)
Physical Therapy Treatment Patient Details Name: Ramond DialDorothy Dubas MRN: [id number] DOB: 1931-05-08 Today's Date: 08/22/2013    History of Present Illness 80 y.o. F admitted 08/18/13 c/o dizziness, weakness, lightheadedness-worse in upright position, fall on 07/18/13 without LOC, mild intermittent dyspnea and nonproductive cough and she was found to be in acute renal failure. CT and chest XR neg. PMHx of DM 2 with renal complications, CKD, HTN, CAD s/p CABG, OSA on nightly CPAP and O2 via [place] at 2 L, and blind in both eyes.    PT Comments    Pt. Is in good spirits and expects to Dc later today.  She usually uses cane for the blind but currently do not believe she will need hand hold assist additionally due to her generalized weakness.  I made pt. And daughter aware of this need for maximum patient safety  Follow Up Recommendations  Home health PT     Equipment Recommendations  None recommended by PT (Pt. uses cane for blind; will need physcial assist as well in the home environment    Recommendations for Other Services       Precautions / Restrictions Precautions Precautions: Fall Restrictions Weight Bearing Restrictions: No    Mobility  Bed Mobility Overal bed mobility:  (not tessted; pt. seated at EOB)                Transfers Overall transfer level: Needs assistance Equipment used: Rolling walker (2 wheeled) Transfers: Sit to/from Stand Sit to Stand: Min assist         General transfer comment: min assist for sit <>stand with verbal and tactile cues due to visual impairment  Ambulation/Gait Ambulation/Gait assistance: Min assist Ambulation Distance (Feet): 80 Feet Assistive device: Rolling walker (2 wheeled) Gait Pattern/deviations: Step-through pattern Gait velocity: Decreased   General Gait Details: Pt. able to tolerate in hallway and back with at least min and sometimes mod assist for guidance /management of RW due to unfamiliar surroundings in visually impaired  pt.   Stairs            Wheelchair Mobility    Modified Rankin (Stroke Patients Only)       Balance                                    Cognition Arousal/Alertness: Awake/alert Behavior During Therapy: WFL for tasks assessed/performed Overall Cognitive Status: Within Functional Limits for tasks assessed                      Exercises      General Comments        Pertinent Vitals/Pain Pain Assessment: No/denies pain    Home Living                      Prior Function            PT Goals (current goals can now be found in the care plan section) Progress towards PT goals: Progressing toward goals    Frequency  Min 3X/week    PT Plan Current plan remains appropriate    Co-evaluation             End of Session Equipment Utilized During Treatment: Gait belt Activity Tolerance: Patient limited by fatigue Patient left: in bed;with call bell/phone within reach;with family/visitor present (daughter in room with pt.)     Time: [id number] PT Time Calculation (min): 15  min  Charges:  $Gait Training: 8-22 mins                    G Codes:      Ferman Hamming 08/22/2013, 4:19 PM Weldon Picking PT Acute Rehab Services [phone] [place] [phone]

## 2013-08-23 NOTE — Care Management Note (Signed)
CARE MANAGEMENT NOTE 08/23/2013  Patient:  Christy Swanson, Christy Swanson   Account Number:  [id number]  Date Initiated:  08/22/2013  Documentation initiated by:  Maruice Pieroni  Subjective/Objective Assessment:   referral for d/c planning.     Action/Plan:   Met with pt , who lives in [place], [place] and is here visiting her daughter when she became ill. Plan to d/c to daughters home and return to [place] , [place] when stronger. Has PCP in [place]. IM given   Anticipated DC Date:  08/22/2013   Anticipated DC Plan:  [place]         [facility] Choice  HOME HEALTH   Choice offered to / List presented to:  C-1 Patient        [place] arranged  HH-1 RN  HH-2 PT      Status of service:  Completed, signed off Medicare Important Message given?  YES (If response is "NO", the following Medicare IM given date fields will be blank) Date Medicare IM given:  08/22/2013 Medicare IM given by:  Tieler Cournoyer Date Additional Medicare IM given:   Additional Medicare IM given by:    Discharge Disposition:  [place]  Per UR Regulation:    If discussed at Long Length of Stay Meetings, dates discussed:    Comments:  08/22/2013 Pt active with [facility] for [facility] aide ( CAP) services, this CM contacted that agency to resume services and faxed D/C summary per their request to [phone] [facility] and HHPT to be provided by [facility], spoke to that agency at [phone] H&P, D/C Summary, [place] orders and face to face faxed to that agency @ [phone]. Followup will be made @ [phone]. Pt and daughter informed of plan and info faxed. Jasmine Pang RN MPH, case manager, [phone] 08/23/2013 Spoke with [facility] [place], this am and address and orders for pt validated. C.Terrius Gentile RN MPH

## 2014-06-29 ENCOUNTER — Encounter (HOSPITAL_BASED_OUTPATIENT_CLINIC_OR_DEPARTMENT_OTHER): Payer: Self-pay | Admitting: Emergency Medicine

## 2014-06-29 ENCOUNTER — Emergency Department (HOSPITAL_BASED_OUTPATIENT_CLINIC_OR_DEPARTMENT_OTHER): Payer: Medicare PPO

## 2014-06-29 ENCOUNTER — Inpatient Hospital Stay (HOSPITAL_BASED_OUTPATIENT_CLINIC_OR_DEPARTMENT_OTHER)
Admission: EM | Admit: 2014-06-29 | Discharge: 2014-07-04 | DRG: 312 | Disposition: A | Discharge status: Home / Self Care | Payer: Medicare PPO | Attending: Internal Medicine | Admitting: Internal Medicine

## 2014-06-29 DIAGNOSIS — M1990 Unspecified osteoarthritis, unspecified site: Secondary | ICD-10-CM | POA: Diagnosis present

## 2014-06-29 DIAGNOSIS — Z9989 Dependence on other enabling machines and devices: Secondary | ICD-10-CM

## 2014-06-29 DIAGNOSIS — D649 Anemia, unspecified: Secondary | ICD-10-CM | POA: Diagnosis present

## 2014-06-29 DIAGNOSIS — H540 Blindness, both eyes: Secondary | ICD-10-CM | POA: Diagnosis present

## 2014-06-29 DIAGNOSIS — I10 Essential (primary) hypertension: Secondary | ICD-10-CM | POA: Diagnosis not present

## 2014-06-29 DIAGNOSIS — E119 Type 2 diabetes mellitus without complications: Secondary | ICD-10-CM

## 2014-06-29 DIAGNOSIS — Z794 Long term (current) use of insulin: Secondary | ICD-10-CM | POA: Diagnosis not present

## 2014-06-29 DIAGNOSIS — I2510 Atherosclerotic heart disease of native coronary artery without angina pectoris: Secondary | ICD-10-CM | POA: Diagnosis present

## 2014-06-29 DIAGNOSIS — R05 Cough: Secondary | ICD-10-CM

## 2014-06-29 DIAGNOSIS — G4733 Obstructive sleep apnea (adult) (pediatric): Secondary | ICD-10-CM | POA: Diagnosis present

## 2014-06-29 DIAGNOSIS — E1122 Type 2 diabetes mellitus with diabetic chronic kidney disease: Secondary | ICD-10-CM | POA: Diagnosis present

## 2014-06-29 DIAGNOSIS — R054 Cough syncope: Secondary | ICD-10-CM

## 2014-06-29 DIAGNOSIS — R55 Syncope and collapse: Secondary | ICD-10-CM | POA: Diagnosis not present

## 2014-06-29 DIAGNOSIS — E269 Hyperaldosteronism, unspecified: Secondary | ICD-10-CM | POA: Diagnosis present

## 2014-06-29 DIAGNOSIS — H547 Unspecified visual loss: Secondary | ICD-10-CM | POA: Diagnosis present

## 2014-06-29 DIAGNOSIS — E876 Hypokalemia: Secondary | ICD-10-CM | POA: Diagnosis present

## 2014-06-29 DIAGNOSIS — I129 Hypertensive chronic kidney disease with stage 1 through stage 4 chronic kidney disease, or unspecified chronic kidney disease: Secondary | ICD-10-CM | POA: Diagnosis present

## 2014-06-29 DIAGNOSIS — R9431 Abnormal electrocardiogram [ECG] [EKG]: Secondary | ICD-10-CM | POA: Diagnosis present

## 2014-06-29 DIAGNOSIS — I5032 Chronic diastolic (congestive) heart failure: Secondary | ICD-10-CM | POA: Diagnosis present

## 2014-06-29 DIAGNOSIS — Z951 Presence of aortocoronary bypass graft: Secondary | ICD-10-CM

## 2014-06-29 DIAGNOSIS — I4581 Long QT syndrome: Secondary | ICD-10-CM | POA: Diagnosis present

## 2014-06-29 DIAGNOSIS — Z7982 Long term (current) use of aspirin: Secondary | ICD-10-CM | POA: Diagnosis not present

## 2014-06-29 DIAGNOSIS — E873 Alkalosis: Secondary | ICD-10-CM | POA: Diagnosis present

## 2014-06-29 DIAGNOSIS — N183 Chronic kidney disease, stage 3 (moderate): Secondary | ICD-10-CM | POA: Diagnosis present

## 2014-06-29 DIAGNOSIS — R1310 Dysphagia, unspecified: Secondary | ICD-10-CM

## 2014-06-29 DIAGNOSIS — N189 Chronic kidney disease, unspecified: Secondary | ICD-10-CM

## 2014-06-29 HISTORY — DX: Heart failure, unspecified: I50.9

## 2014-06-29 LAB — COMPREHENSIVE METABOLIC PANEL
ALBUMIN: 3.7 g/dL (ref 3.5–5.0)
ALT: 12 U/L — ABNORMAL LOW (ref 14–54)
ALT: 11 U/L — AB (ref 14–54)
ANION GAP: 12 (ref 5–15)
AST: 27 U/L (ref 15–41)
AST: 21 U/L (ref 15–41)
Albumin: 4.2 g/dL (ref 3.5–5.0)
Alkaline Phosphatase: 73 U/L (ref 38–126)
Alkaline Phosphatase: 62 U/L (ref 38–126)
Anion gap: 15 (ref 5–15)
BILIRUBIN TOTAL: 0.8 mg/dL (ref 0.3–1.2)
BUN: 39 mg/dL — ABNORMAL HIGH (ref 6–20)
BUN: 34 mg/dL — ABNORMAL HIGH (ref 6–20)
CHLORIDE: 84 mmol/L — AB (ref 101–111)
CHLORIDE: 83 mmol/L — AB (ref 101–111)
CO2: 37 mmol/L — ABNORMAL HIGH (ref 22–32)
CO2: 38 mmol/L — ABNORMAL HIGH (ref 22–32)
CREATININE: 1.6 mg/dL — AB (ref 0.44–1.00)
Calcium: 10 mg/dL (ref 8.9–10.3)
Calcium: 10.1 mg/dL (ref 8.9–10.3)
Creatinine, Ser: 1.68 mg/dL — ABNORMAL HIGH (ref 0.44–1.00)
GFR calc Af Amer: 32 mL/min — ABNORMAL LOW (ref >60)
GFR calc non Af Amer: 28 mL/min — ABNORMAL LOW (ref >60)
GFR calc non Af Amer: 29 mL/min — ABNORMAL LOW (ref >60)
GFR, EST AFRICAN AMERICAN: 34 mL/min — AB (ref >60)
Glucose, Bld: 220 mg/dL — ABNORMAL HIGH (ref 65–99)
Glucose, Bld: 160 mg/dL — ABNORMAL HIGH (ref 65–99)
Potassium: 2.2 mmol/L — CL (ref 3.5–5.1)
Potassium: <2 mmol/L — CL (ref 3.5–5.1)
SODIUM: 133 mmol/L — AB (ref 135–145)
Sodium: 136 mmol/L (ref 135–145)
TOTAL PROTEIN: 7.3 g/dL (ref 6.5–8.1)
Total Bilirubin: 0.5 mg/dL (ref 0.3–1.2)
Total Protein: 8.4 g/dL — ABNORMAL HIGH (ref 6.5–8.1)

## 2014-06-29 LAB — CBC WITH DIFFERENTIAL/PLATELET
BASOS PCT: 0 % (ref 0–1)
Basophils Absolute: 0 10*3/uL (ref 0.0–0.1)
EOS ABS: 0.1 10*3/uL (ref 0.0–0.7)
Eosinophils Relative: 1 % (ref 0–5)
HCT: 41.5 % (ref 36.0–46.0)
Hemoglobin: 14.2 g/dL (ref 12.0–15.0)
LYMPHS ABS: 2.8 10*3/uL (ref 0.7–4.0)
LYMPHS PCT: 33 % (ref 12–46)
MCH: 29.8 pg (ref 26.0–34.0)
MCHC: 34.2 g/dL (ref 30.0–36.0)
MCV: 87 fL (ref 78.0–100.0)
MONOS PCT: 14 % — AB (ref 3–12)
Monocytes Absolute: 1.2 10*3/uL — ABNORMAL HIGH (ref 0.1–1.0)
Neutro Abs: 4.6 10*3/uL (ref 1.7–7.7)
Neutrophils Relative %: 52 % (ref 43–77)
Platelets: 246 10*3/uL (ref 150–400)
RBC: 4.77 MIL/uL (ref 3.87–5.11)
RDW: 13.8 % (ref 11.5–15.5)
WBC: 8.7 10*3/uL (ref 4.0–10.5)

## 2014-06-29 LAB — GLUCOSE, CAPILLARY: GLUCOSE-CAPILLARY: 157 mg/dL — AB (ref 65–99)

## 2014-06-29 LAB — TROPONIN I
Troponin I: <0.03 ng/mL (ref <0.031)
Troponin I: <0.03 ng/mL (ref <0.031)

## 2014-06-29 LAB — MAGNESIUM
MAGNESIUM: 1.4 mg/dL — AB (ref 1.7–2.4)
Magnesium: 1.4 mg/dL — ABNORMAL LOW (ref 1.7–2.4)

## 2014-06-29 LAB — CBG MONITORING, ED: Glucose-Capillary: 144 mg/dL — ABNORMAL HIGH (ref 65–99)

## 2014-06-29 MED ORDER — POTASSIUM CHLORIDE 10 MEQ/100ML IV SOLN
10.0000 meq | Freq: Once | INTRAVENOUS | Status: DC
  Filled 2014-06-29: qty 100

## 2014-06-29 MED ORDER — POTASSIUM CHLORIDE 10 MEQ/100ML IV SOLN
10.0000 meq | Freq: Once | INTRAVENOUS | Status: AC
  Administered 2014-06-29: 10 meq via INTRAVENOUS

## 2014-06-29 MED ORDER — ASPIRIN 300 MG RE SUPP
300.0000 mg | Freq: Once | RECTAL | Status: AC
  Administered 2014-06-30: 300 mg via RECTAL
  Filled 2014-06-29: qty 1

## 2014-06-29 MED ORDER — ONDANSETRON HCL 4 MG/2ML IJ SOLN: 4.0000 mg | Freq: Four times a day (QID) | INTRAMUSCULAR | Status: DC | PRN

## 2014-06-29 MED ORDER — ONDANSETRON HCL 4 MG PO TABS: 4.0000 mg | ORAL_TABLET | Freq: Four times a day (QID) | ORAL | Status: DC | PRN

## 2014-06-29 MED ORDER — PANTOPRAZOLE SODIUM 40 MG IV SOLR
40.0000 mg | INTRAVENOUS | Status: DC
  Administered 2014-06-29 – 2014-06-30 (×2): 40 mg via INTRAVENOUS
  Filled 2014-06-29 (×3): qty 40

## 2014-06-29 MED ORDER — ACETAMINOPHEN 650 MG RE SUPP: 650.0000 mg | Freq: Four times a day (QID) | RECTAL | Status: DC | PRN

## 2014-06-29 MED ORDER — ACETAMINOPHEN 325 MG PO TABS
650.0000 mg | ORAL_TABLET | Freq: Four times a day (QID) | ORAL | Status: DC | PRN
  Administered 2014-06-30: 650 mg via ORAL
  Filled 2014-06-29: qty 2

## 2014-06-29 MED ORDER — ONDANSETRON HCL 4 MG/2ML IJ SOLN: 4.0000 mg | Freq: Three times a day (TID) | INTRAMUSCULAR | Status: DC | PRN

## 2014-06-29 MED ORDER — MAGNESIUM SULFATE IN D5W 10-5 MG/ML-% IV SOLN
1.0000 g | Freq: Once | INTRAVENOUS | Status: AC
  Administered 2014-06-29: 1 g via INTRAVENOUS
  Filled 2014-06-29: qty 100

## 2014-06-29 MED ORDER — POTASSIUM CHLORIDE 10 MEQ/100ML IV SOLN
10.0000 meq | INTRAVENOUS | Status: AC
  Administered 2014-06-29 – 2014-06-30 (×3): 10 meq via INTRAVENOUS
  Filled 2014-06-29 (×3): qty 100

## 2014-06-29 MED ORDER — HEPARIN SODIUM (PORCINE) 5000 UNIT/ML IJ SOLN
5000.0000 [IU] | Freq: Three times a day (TID) | INTRAMUSCULAR | Status: DC
  Administered 2014-06-29 – 2014-07-04 (×14): 5000 [IU] via SUBCUTANEOUS
  Filled 2014-06-29 (×18): qty 1

## 2014-06-29 MED ORDER — SODIUM CHLORIDE 0.9 % IJ SOLN
3.0000 mL | Freq: Two times a day (BID) | INTRAMUSCULAR | Status: DC
  Administered 2014-06-29 – 2014-07-03 (×6): 3 mL via INTRAVENOUS

## 2014-06-29 MED ORDER — ONDANSETRON HCL 4 MG/2ML IJ SOLN: 4.0000 mg | Freq: Two times a day (BID) | INTRAMUSCULAR | Status: DC | PRN

## 2014-06-29 MED ORDER — INSULIN ASPART 100 UNIT/ML ~~LOC~~ SOLN
0.0000 [IU] | Freq: Four times a day (QID) | SUBCUTANEOUS | Status: DC
  Administered 2014-06-29: 3 [IU] via SUBCUTANEOUS
  Administered 2014-06-30: 5 [IU] via SUBCUTANEOUS

## 2014-06-29 MED ORDER — SODIUM CHLORIDE 0.9 % IV SOLN: INTRAVENOUS | Status: DC

## 2014-06-29 NOTE — ED Provider Notes (Signed)
CSN: [id number]     Arrival date & time 06/29/14  1625 History  This chart was scribed for Nelva Nay, MD by Doreatha Martin, ED Scribe. This patient was seen in room MH08/MH08 and the patient's care was started at 5:00 PM.     Chief Complaint  Patient presents with  . Dizziness   The history is provided by the patient and a relative. No language interpreter was used.    HPI Comments: Christy Swanson is a 81 y.o. female with no Hx of epilepsy who presents to the Emergency Department complaining of a moderate episode of dizziness onset this evening. Pt states that she was choking on some water at a restaurant when she "blanked out". Per family member, the pt acted like she was having a seizure and became combative. She denies "room-spinning" dizziness and LOC.    Past Medical History  Diagnosis Date  . Diabetes mellitus without complication   . Hypertension   . Coronary artery disease   . Arthritis   . CHF (congestive heart failure)    Past Surgical History  Procedure Laterality Date  . Coronary artery bypass graft     History reviewed. No pertinent family history. History  Substance Use Topics  . Smoking status: Never Smoker   . Smokeless tobacco: Not on file  . Alcohol Use: No   OB History    No data available     Review of Systems A complete 10 system review of systems was obtained and all systems are negative except as noted in the HPI and PMH.    Allergies  Review of patient's allergies indicates no known allergies.  Home Medications   Prior to Admission medications   Medication Sig Start Date End Date Taking? Authorizing Provider  ALPRAZolam (XANAX) 0.25 MG tablet Take 0.25 mg by mouth every other day.    Yes Historical Provider, MD  aspirin EC 81 MG tablet Take 1 tablet (81 mg total) by mouth daily. 08/22/13  Yes Elease Etienne, MD  atorvastatin (LIPITOR) 20 MG tablet Take 20 mg by mouth daily.   Yes Historical Provider, MD  carvedilol (COREG) 3.125 MG tablet Take  3.125 mg by mouth 2 (two) times daily with a meal.   Yes Historical Provider, MD  citalopram (CELEXA) 20 MG tablet Take 1 tablet (20 mg total) by mouth daily. 08/22/13  Yes Elease Etienne, MD  colchicine 0.6 MG tablet Take 0.6 mg by mouth 2 (two) times daily as needed (for gout).   Yes Historical Provider, MD  donepezil (ARICEPT) 10 MG tablet Take 10 mg by mouth at bedtime.   Yes Historical Provider, MD  gabapentin (NEURONTIN) 300 MG capsule Take 300 mg by mouth 2 (two) times daily.   Yes Historical Provider, MD  nitroGLYCERIN (NITROSTAT) 0.4 MG SL tablet Place 1 tablet (0.4 mg total) under the tongue every 5 (five) minutes as needed for chest pain (upto 3 doses.). 08/22/13  Yes Elease Etienne, MD  sucralfate (CARAFATE) 1 G tablet Take 1 g by mouth 3 (three) times daily.    Yes Historical Provider, MD  Insulin Isophane & Regular Human (HUMULIN 70/30 MIX) (70-30) 100 UNIT/ML PEN 50 U in AM 15 min prior to breakfast 40 U in PM 15 min prior to dinner 07/06/14   Calvert Cantor, MD  isosorbide mononitrate (IMDUR) 30 MG 24 hr tablet Take 1 tablet (30 mg total) by mouth daily. 07/04/14   Calvert Cantor, MD  spironolactone (ALDACTONE) 25 MG tablet Take 1  tablet (25 mg total) by mouth daily. 07/04/14   Calvert Cantor, MD   Triage VS: BP 134/56 mmHg  Pulse 69  Resp 18  Ht  (1.575 m)  Wt 200 lb (90.719 kg)  BMI 36.57 kg/m2  SpO2 94% Physical Exam  Constitutional: She is oriented to person, place, and time. She appears well-developed and well-nourished. No distress.  HENT:  Head: Normocephalic and atraumatic.  Eyes: Pupils are equal, round, and reactive to light.  Neck: Normal range of motion.  Cardiovascular: Normal rate and intact distal pulses.   Pulmonary/Chest: No respiratory distress.  Abdominal: Normal appearance. She exhibits no distension.  Musculoskeletal: Normal range of motion.  Neurological: She is alert and oriented to person, place, and time. No cranial nerve deficit.  Skin: Skin is warm  and dry. No rash noted.  Psychiatric: She has a normal mood and affect. Her behavior is normal.  Nursing note and vitals reviewed.   ED Course  Procedures (including critical care time) DIAGNOSTIC STUDIES: Oxygen Saturation is 94% on RA, adequate by my interpretation.    COORDINATION OF CARE: 5:04 PM Discussed treatment plan with pt at bedside and pt agreed to plan.   Labs Review Labs Reviewed  CBC WITH DIFFERENTIAL/PLATELET - Abnormal; Notable for the following:    Monocytes Relative 14 (*)    Monocytes Absolute 1.2 (*)    All other components within normal limits  COMPREHENSIVE METABOLIC PANEL - Abnormal; Notable for the following:    Sodium 133 (*)    Potassium 2.2 (*)    Chloride 84 (*)    CO2 37 (*)    Glucose, Bld 220 (*)    BUN 39 (*)    Creatinine, Ser 1.68 (*)    Total Protein 8.4 (*)    ALT 12 (*)    GFR calc non Af Amer 28 (*)    GFR calc Af Amer 32 (*)    All other components within normal limits  MAGNESIUM - Abnormal; Notable for the following:    Magnesium 1.4 (*)    All other components within normal limits  HEMOGLOBIN A1C - Abnormal; Notable for the following:    Hgb A1c MFr Bld 9.0 (*)    All other components within normal limits  COMPREHENSIVE METABOLIC PANEL - Abnormal; Notable for the following:    Potassium <2.0 (*)    Chloride 83 (*)    CO2 38 (*)    Glucose, Bld 160 (*)    BUN 34 (*)    Creatinine, Ser 1.60 (*)    ALT 11 (*)    GFR calc non Af Amer 29 (*)    GFR calc Af Amer 34 (*)    All other components within normal limits  MAGNESIUM - Abnormal; Notable for the following:    Magnesium 1.4 (*)    All other components within normal limits  GLUCOSE, CAPILLARY - Abnormal; Notable for the following:    Glucose-Capillary 157 (*)    All other components within normal limits  CBC WITH DIFFERENTIAL/PLATELET - Abnormal; Notable for the following:    Monocytes Absolute 1.2 (*)    All other components within normal limits  COMPREHENSIVE  METABOLIC PANEL - Abnormal; Notable for the following:    Sodium 134 (*)    Potassium 2.4 (*)    Chloride 84 (*)    CO2 36 (*)    Glucose, Bld 225 (*)    BUN 34 (*)    Creatinine, Ser 1.61 (*)  ALT 10 (*)    GFR calc non Af Amer 29 (*)    GFR calc Af Amer 34 (*)    All other components within normal limits  MAGNESIUM - Abnormal; Notable for the following:    Magnesium 1.6 (*)    All other components within normal limits  GLUCOSE, CAPILLARY - Abnormal; Notable for the following:    Glucose-Capillary 227 (*)    All other components within normal limits  MAGNESIUM - Abnormal; Notable for the following:    Magnesium 2.6 (*)    All other components within normal limits  BASIC METABOLIC PANEL - Abnormal; Notable for the following:    Sodium 134 (*)    Potassium 3.2 (*)    Chloride 86 (*)    CO2 33 (*)    Glucose, Bld 276 (*)    BUN 28 (*)    Creatinine, Ser 1.35 (*)    GFR calc non Af Amer 36 (*)    GFR calc Af Amer 42 (*)    All other components within normal limits  GLUCOSE, CAPILLARY - Abnormal; Notable for the following:    Glucose-Capillary 348 (*)    All other components within normal limits  GLUCOSE, CAPILLARY - Abnormal; Notable for the following:    Glucose-Capillary 279 (*)    All other components within normal limits  GLUCOSE, CAPILLARY - Abnormal; Notable for the following:    Glucose-Capillary 227 (*)    All other components within normal limits  GLUCOSE, CAPILLARY - Abnormal; Notable for the following:    Glucose-Capillary 160 (*)    All other components within normal limits  GLUCOSE, CAPILLARY - Abnormal; Notable for the following:    Glucose-Capillary 249 (*)    All other components within normal limits  BASIC METABOLIC PANEL - Abnormal; Notable for the following:    Chloride 94 (*)    Glucose, Bld 290 (*)    BUN 22 (*)    Creatinine, Ser 1.41 (*)    GFR calc non Af Amer 34 (*)    GFR calc Af Amer 40 (*)    All other components within normal limits   BASIC METABOLIC PANEL - Abnormal; Notable for the following:    Potassium 2.7 (*)    Chloride 93 (*)    CO2 33 (*)    Glucose, Bld 198 (*)    Creatinine, Ser 1.15 (*)    Calcium 8.8 (*)    GFR calc non Af Amer 44 (*)    GFR calc Af Amer 51 (*)    All other components within normal limits  GLUCOSE, CAPILLARY - Abnormal; Notable for the following:    Glucose-Capillary 282 (*)    All other components within normal limits  GLUCOSE, CAPILLARY - Abnormal; Notable for the following:    Glucose-Capillary 161 (*)    All other components within normal limits  BASIC METABOLIC PANEL - Abnormal; Notable for the following:    Potassium 3.3 (*)    Chloride 94 (*)    CO2 34 (*)    Glucose, Bld 180 (*)    Creatinine, Ser 1.14 (*)    GFR calc non Af Amer 44 (*)    GFR calc Af Amer 51 (*)    All other components within normal limits  GLUCOSE, CAPILLARY - Abnormal; Notable for the following:    Glucose-Capillary 221 (*)    All other components within normal limits  NA AND K (SODIUM & POTASSIUM), 24 H UR - Abnormal; Notable for  the following:    Sodium, 24H Ur 11 (*)    Potassium, 24H Ur 8 (*)    All other components within normal limits  CREATININE, URINE, 24 HOUR - Abnormal; Notable for the following:    Creatinine, 24H Ur 582 (*)    All other components within normal limits  GLUCOSE, CAPILLARY - Abnormal; Notable for the following:    Glucose-Capillary 154 (*)    All other components within normal limits  BASIC METABOLIC PANEL - Abnormal; Notable for the following:    Potassium 2.8 (*)    Chloride 97 (*)    CO2 33 (*)    Glucose, Bld 45 (*)    Calcium 8.6 (*)    GFR calc non Af Amer 52 (*)    All other components within normal limits  GLUCOSE, CAPILLARY - Abnormal; Notable for the following:    Glucose-Capillary 203 (*)    All other components within normal limits  GLUCOSE, CAPILLARY - Abnormal; Notable for the following:    Glucose-Capillary 101 (*)    All other components within  normal limits  GLUCOSE, CAPILLARY - Abnormal; Notable for the following:    Glucose-Capillary 58 (*)    All other components within normal limits  GLUCOSE, CAPILLARY - Abnormal; Notable for the following:    Glucose-Capillary 59 (*)    All other components within normal limits  GLUCOSE, CAPILLARY - Abnormal; Notable for the following:    Glucose-Capillary 144 (*)    All other components within normal limits  BASIC METABOLIC PANEL - Abnormal; Notable for the following:    Glucose, Bld 108 (*)    Creatinine, Ser 1.07 (*)    Calcium 8.6 (*)    GFR calc non Af Amer 48 (*)    GFR calc Af Amer 55 (*)    All other components within normal limits  GLUCOSE, CAPILLARY - Abnormal; Notable for the following:    Glucose-Capillary 175 (*)    All other components within normal limits  BASIC METABOLIC PANEL - Abnormal; Notable for the following:    Creatinine, Ser 1.01 (*)    Calcium 8.4 (*)    GFR calc non Af Amer 51 (*)    GFR calc Af Amer 59 (*)    All other components within normal limits  GLUCOSE, CAPILLARY - Abnormal; Notable for the following:    Glucose-Capillary 117 (*)    All other components within normal limits  GLUCOSE, CAPILLARY - Abnormal; Notable for the following:    Glucose-Capillary 143 (*)    All other components within normal limits  CBG MONITORING, ED - Abnormal; Notable for the following:    Glucose-Capillary 144 (*)    All other components within normal limits  TROPONIN I  TROPONIN I  TROPONIN I  TROPONIN I  URINALYSIS, ROUTINE W REFLEX MICROSCOPIC (NOT AT [facility])  MAGNESIUM  GLUCOSE, CAPILLARY  ALDOSTERONE + RENIN ACTIVITY W/ RATIO  GLUCOSE, CAPILLARY  GLUCOSE, CAPILLARY    Imaging Review No results found.   EKG Interpretation   Date/Time:  Sunday June 29 2014 17:07:18 EDT Ventricular Rate:  64 PR Interval:  164 QRS Duration: 146 QT Interval:  572 QTC Calculation: 590 R Axis:   128 Text Interpretation:  Sinus rhythm with Premature atrial complexes  Right  axis deviation Non-specific intra-ventricular conduction block Abnormal  ECG Confirmed by Charlen Bakula  MD, Latitia Housewright ([id number]) on 06/29/2014 5:09:00 PM      MDM   Final diagnoses:  Cough syncope  Hypokalemia   I  personally performed the services described in this documentation, which was scribed in my presence. The recorded information has been reviewed and considered.   Nelva Nay, MD 07/13/14 [phone]

## 2014-06-29 NOTE — ED Notes (Signed)
Pt in c/o choking on some water and then "blanking out", denies fall or LOC. Pt is A+O and answering questions appropriately. Family states the pt was "shaking like she was having a seizures" but does not have hx of epilepsy.

## 2014-06-29 NOTE — H&P (Signed)
[facility] History and Physical  Patient: Christy Swanson  MRN: [id number]  DOB: 1931-03-03  DOS: the patient was seen and examined on 06/29/2014 PCP: Eric Form, MD  Chief Complaint: Passing out event  HPI: Christy Swanson is a 81 y.o. female with Past medical history of diabetes mellitus, hypertension, coronary artery disease, chronic kidney disease, anemia, chronic diastolic dysfunction, type 2 diabetes mellitus with chronic kidney disease, obstructive sleep apnea on C Pap. The patient is presenting with complaints of syncope. She mentions she was at her baseline and at restaurant and while she was drinking Coke she suddenly started choking and then passed out. It is unclear how long she has remained unconscious. She was more confused when she woke up and there was no report of 4 loss of control of bowel or bladder. She denies any dizziness or lightheadedness at time of my evaluation no headache no speech difficulty no prior choking episodes. She denies any chest pain, shortness of breath, palpitation, abdominal pain, diarrhea, constipation, burning urination. She doesn't remember all her medications and denies any recent change in the medications. Her daughter helps with her medications. She walks with a cane at her baseline and no recent falls reported. She complains that she had some right leg tingling 2 days ago which is resolved at present.  The patient is coming from home And at her baseline independent for most of her ADL.  Review of Systems: as mentioned in the history of present illness.  A comprehensive review of the other systems is negative.  Past Medical History  Diagnosis Date  . Diabetes mellitus without complication   . Hypertension   . Coronary artery disease   . Arthritis   . CHF (congestive heart failure)    Past Surgical History  Procedure Laterality Date  . Coronary artery bypass graft     Social History:  reports that she has never smoked. She  does not have any smokeless tobacco history on file. She reports that she does not drink alcohol. Her drug history is not on file.  No Known Allergies  History reviewed. No pertinent family history.  Prior to Admission medications   Medication Sig Start Date End Date Taking? Authorizing Provider  aspirin EC 81 MG tablet Take 1 tablet (81 mg total) by mouth daily. 08/22/13  Yes Elease Etienne, MD  furosemide (LASIX) 80 MG tablet Take 0.5 tablets (40 mg total) by mouth daily. 08/22/13  Yes Elease Etienne, MD  insulin NPH-regular Human (NOVOLIN 70/30) (70-30) 100 UNIT/ML injection Inject 40-60 Units into the skin 2 (two) times daily with a meal. 60 units q am, 40 units qpm Patient taking differently: Inject 40-50 Units into the skin 2 (two) times daily with a meal. 50 units q am, 40 units qpm 08/23/13  Yes Elease Etienne, MD  ALPRAZolam Prudy Feeler) 0.25 MG tablet Take 0.25 mg by mouth at bedtime as needed for anxiety.    Historical Provider, MD  amLODipine (NORVASC) 5 MG tablet Take 5 mg by mouth daily.    Historical Provider, MD  atorvastatin (LIPITOR) 20 MG tablet Take 20 mg by mouth daily.    Historical Provider, MD  carvedilol (COREG) 3.125 MG tablet Take 3.125 mg by mouth 2 (two) times daily with a meal.    Historical Provider, MD  citalopram (CELEXA) 20 MG tablet Take 1 tablet (20 mg total) by mouth daily. 08/22/13   Elease Etienne, MD  donepezil (ARICEPT) 10 MG tablet Take 10 mg by mouth at bedtime.  Historical Provider, MD  esomeprazole (NEXIUM) 40 MG capsule Take 40 mg by mouth daily at 12 noon.    Historical Provider, MD  gabapentin (NEURONTIN) 300 MG capsule Take 300 mg by mouth 2 (two) times daily.    Historical Provider, MD  isosorbide mononitrate (IMDUR) 30 MG 24 hr tablet Take 30 mg by mouth 2 (two) times daily.     Historical Provider, MD  nitroGLYCERIN (NITROSTAT) 0.4 MG SL tablet Place 1 tablet (0.4 mg total) under the tongue every 5 (five) minutes as needed for chest pain (upto 3  doses.). 08/22/13   Elease Etienne, MD  potassium chloride SA (K-DUR,KLOR-CON) 20 MEQ tablet Take 20 mEq by mouth daily.     Historical Provider, MD  sucralfate (CARAFATE) 1 G tablet Take 1 g by mouth 3 (three) times daily.     Historical Provider, MD    Physical Exam: Filed Vitals:   06/29/14 1830 06/29/14 1900 06/29/14 1930 06/29/14 2119  BP: 119/61 121/59 116/66 141/58  Pulse: 59 64 58 62  Temp:    98.1 F (36.7 C)  TempSrc:    Oral  Resp: Height:      Weight:      SpO2: 93% 96% 95% 92%    General: Alert, Awake and Oriented to Time, Place and Person. Appear in mild distress Eyes: Bilateral corneal haziness chronic ENT: Oral Mucosa clear moist. Neck: Difficult to assess JVD Cardiovascular: S1 and S2 Present, no Murmur, Peripheral Pulses Present Respiratory: Bilateral Air entry equal and Decreased,  Clear to Auscultation, no Crackles, occasional wheezes which improved after cough Abdomen: Bowel Sound present, Soft and non tender Skin: no Rash Extremities: Trace Pedal edema, no calf tenderness Neurologic: Grossly no focal neuro deficit.  Labs on Admission:  CBC:  Recent Labs Lab 06/29/14 1712  WBC 8.7  NEUTROABS 4.6  HGB 14.2  HCT 41.5  MCV 87.0  PLT 246    CMP     Component Value Date/Time   NA 133* 06/29/2014 1712   K 2.2* 06/29/2014 1712   CL 84* 06/29/2014 1712   CO2 37* 06/29/2014 1712   GLUCOSE 220* 06/29/2014 1712   BUN 39* 06/29/2014 1712   CREATININE 1.68* 06/29/2014 1712   CALCIUM 10.0 06/29/2014 1712   PROT 8.4* 06/29/2014 1712   ALBUMIN 4.2 06/29/2014 1712   AST 27 06/29/2014 1712   ALT 12* 06/29/2014 1712   ALKPHOS 73 06/29/2014 1712   BILITOT 0.5 06/29/2014 1712   GFRNONAA 28* 06/29/2014 1712   GFRAA 32* 06/29/2014 1712    No results for input(s): LIPASE, AMYLASE in the last 168 hours.   Recent Labs Lab 06/29/14 1712  TROPONINI <0.03   BNP (last 3 results) No results for input(s): BNP in the last 8760  hours.  ProBNP (last 3 results)  Recent Labs  08/20/13 1031  PROBNP 1444.0*     Radiological Exams on Admission: Dg Chest 2 View  06/29/2014   CLINICAL DATA:  Acute onset of dizziness. Choked on water. Initial encounter.  EXAM: CHEST  2 VIEW  COMPARISON:  Chest radiograph performed 08/20/2013  FINDINGS: The lungs are well-aerated. Mild bilateral atelectasis or scarring is noted. The lungs are otherwise clear. There is no evidence of pleural effusion or pneumothorax.  The heart is borderline normal in size. The patient is status post median sternotomy. No acute osseous abnormalities are seen.  IMPRESSION: Mild bilateral atelectasis or scarring noted. Lungs otherwise clear.   Electronically Signed  By: Roanna Raider M.D.   On: 06/29/2014 17:59   Ct Head Wo Contrast  06/29/2014   CLINICAL DATA:  Cough, syncope, denies headache and dizziness  EXAM: CT HEAD WITHOUT CONTRAST  TECHNIQUE: Contiguous axial images were obtained from the base of the skull through the vertex without intravenous contrast.  COMPARISON:  08/18/2013  FINDINGS: There is no evidence of mass effect, midline shift, or extra-axial fluid collections. There is no evidence of a space-occupying lesion or intracranial hemorrhage. There is no evidence of a cortical-based area of acute infarction. There is generalized cerebral atrophy. There is periventricular white matter low attenuation likely secondary to microangiopathy.  The ventricles and sulci are appropriate for the patient's age. The basal cisterns are patent.  Incidental note is made of left globe prosthesis. The right orbit is normal. The visualized portions of the paranasal sinuses and mastoid air cells are unremarkable. Cerebrovascular atherosclerotic calcifications are noted.  The osseous structures are unremarkable.  IMPRESSION: 1. No acute intracranial pathology. 2. Chronic microvascular disease and cerebral atrophy.   Electronically Signed   By: Elige Ko   On: 06/29/2014  17:57   EKG: Independently reviewed. prolonged QT interval, nonspecific intraventricular conduction delay which is chronic, normal sinus rhythm.  Assessment/Plan Principal Problem:   Hypokalemia Active Problems:   CAD (coronary artery disease)   S/P CABG (coronary artery bypass graft)   Diabetes mellitus   Blindness   Essential hypertension, benign   Prolonged Q-T interval on ECG   Dysphagia   OSA on CPAP   1. Hypokalemia  Hypomagnesemia Syncope Prolonged QTC  The patient is presenting with complaints of choking episode. After that she also passed out. On workup she is found to have prolonged QTC on EKG which is chronic for her. She is also found to be having hypokalemia and hypomagnesemia. Currently I will recheck her labs and replace her magnesium level as well as potassium. We'll recheck potassium level after replacement. She is taking Lasix which could be possible reason off her hypokalemia and hypomagnesemia he may require chronic replacement at home as she has been in the hospital earlier in the past with similar symptoms.  2. Dysphagia, choking episode. Patient presented which choking episode which led to syncope. Most likely vasovagal. CT scan of the head is negative and neurological examination is unremarkable. She mentioned about right-sided numbness that occurred 2 days ago which is resolved. With this I would opt an MRI of the brain in the morning. Speech therapy in the morning. Aspirin once. Monitor on telemetry. Serial neuro checks. Suspicion of stroke is less likely as a cause for dysphagia and more likely pharyngeal dysfunction. Neurology will be consulted if MRI is positive.  3. Chronic kidney disease with mild worsening. The patient presents with a syncope event. Serum creatinine mildly elevated as compared to earlier. Probably progression of the chronic kidney disease. Currently with her history of CHF, holding off on aggressive IV hydration. Monitor  BMP. Avoiding nephrotoxic medication.  4. Obstructive sleep apnea on C Pap. CPAP daily at bedtime.  5. Diabetes mellitus. Resume her insulin sliding scale every 6 hours.  Advance goals of care discussion: Full code as per my discussion with patient  Patient's daughter is her primary caregiver  DVT Prophylaxis: subcutaneous Heparin Nutrition:  nothing by mouth   Disposition: Admitted as inpatient, telemetry unit.  Author: Lynden Oxford, MD [facility] Pager: [phone] 06/29/2014  If 7PM-7AM, please contact night-coverage [email or web address] Password [email or web address]

## 2014-06-30 ENCOUNTER — Inpatient Hospital Stay (HOSPITAL_COMMUNITY): Payer: Medicare PPO

## 2014-06-30 DIAGNOSIS — R1310 Dysphagia, unspecified: Secondary | ICD-10-CM

## 2014-06-30 LAB — CBC WITH DIFFERENTIAL/PLATELET
Basophils Absolute: 0 10*3/uL (ref 0.0–0.1)
Basophils Relative: 0 % (ref 0–1)
EOS PCT: 1 % (ref 0–5)
Eosinophils Absolute: 0.1 10*3/uL (ref 0.0–0.7)
HCT: 38.1 % (ref 36.0–46.0)
HEMOGLOBIN: 13.2 g/dL (ref 12.0–15.0)
LYMPHS ABS: 3.2 10*3/uL (ref 0.7–4.0)
LYMPHS PCT: 31 % (ref 12–46)
MCH: 30 pg (ref 26.0–34.0)
MCHC: 34.6 g/dL (ref 30.0–36.0)
MCV: 86.6 fL (ref 78.0–100.0)
MONO ABS: 1.2 10*3/uL — AB (ref 0.1–1.0)
Monocytes Relative: 12 % (ref 3–12)
Neutro Abs: 5.9 10*3/uL (ref 1.7–7.7)
Neutrophils Relative %: 56 % (ref 43–77)
PLATELETS: 202 10*3/uL (ref 150–400)
RBC: 4.4 MIL/uL (ref 3.87–5.11)
RDW: 14.1 % (ref 11.5–15.5)
WBC: 10.4 10*3/uL (ref 4.0–10.5)

## 2014-06-30 LAB — GLUCOSE, CAPILLARY
GLUCOSE-CAPILLARY: 279 mg/dL — AB (ref 65–99)
GLUCOSE-CAPILLARY: 227 mg/dL — AB (ref 65–99)
Glucose-Capillary: 227 mg/dL — ABNORMAL HIGH (ref 65–99)
Glucose-Capillary: 348 mg/dL — ABNORMAL HIGH (ref 65–99)

## 2014-06-30 LAB — COMPREHENSIVE METABOLIC PANEL
ALK PHOS: 69 U/L (ref 38–126)
ALT: 10 U/L — AB (ref 14–54)
AST: 20 U/L (ref 15–41)
Albumin: 3.6 g/dL (ref 3.5–5.0)
Anion gap: 14 (ref 5–15)
BUN: 34 mg/dL — AB (ref 6–20)
CO2: 36 mmol/L — AB (ref 22–32)
Calcium: 9.4 mg/dL (ref 8.9–10.3)
Chloride: 84 mmol/L — ABNORMAL LOW (ref 101–111)
Creatinine, Ser: 1.61 mg/dL — ABNORMAL HIGH (ref 0.44–1.00)
GFR, EST AFRICAN AMERICAN: 34 mL/min — AB (ref >60)
GFR, EST NON AFRICAN AMERICAN: 29 mL/min — AB (ref >60)
GLUCOSE: 225 mg/dL — AB (ref 65–99)
POTASSIUM: 2.4 mmol/L — AB (ref 3.5–5.1)
SODIUM: 134 mmol/L — AB (ref 135–145)
Total Bilirubin: 0.7 mg/dL (ref 0.3–1.2)
Total Protein: 7 g/dL (ref 6.5–8.1)

## 2014-06-30 LAB — URINALYSIS, ROUTINE W REFLEX MICROSCOPIC
BILIRUBIN URINE: NEGATIVE
Glucose, UA: NEGATIVE mg/dL
Hgb urine dipstick: NEGATIVE
KETONES UR: NEGATIVE mg/dL
Leukocytes, UA: NEGATIVE
Nitrite: NEGATIVE
PH: 6 (ref 5.0–8.0)
Protein, ur: NEGATIVE mg/dL
Specific Gravity, Urine: 1.01 (ref 1.005–1.030)
Urobilinogen, UA: 0.2 mg/dL (ref 0.0–1.0)

## 2014-06-30 LAB — BASIC METABOLIC PANEL
Anion gap: 15 (ref 5–15)
BUN: 28 mg/dL — ABNORMAL HIGH (ref 6–20)
CO2: 33 mmol/L — ABNORMAL HIGH (ref 22–32)
CREATININE: 1.35 mg/dL — AB (ref 0.44–1.00)
Calcium: 9.6 mg/dL (ref 8.9–10.3)
Chloride: 86 mmol/L — ABNORMAL LOW (ref 101–111)
GFR calc non Af Amer: 36 mL/min — ABNORMAL LOW (ref >60)
GFR, EST AFRICAN AMERICAN: 42 mL/min — AB (ref >60)
Glucose, Bld: 276 mg/dL — ABNORMAL HIGH (ref 65–99)
Potassium: 3.2 mmol/L — ABNORMAL LOW (ref 3.5–5.1)
Sodium: 134 mmol/L — ABNORMAL LOW (ref 135–145)

## 2014-06-30 LAB — TROPONIN I: Troponin I: <0.03 ng/mL (ref <0.031)

## 2014-06-30 LAB — MAGNESIUM
MAGNESIUM: 1.6 mg/dL — AB (ref 1.7–2.4)
MAGNESIUM: 2.6 mg/dL — AB (ref 1.7–2.4)

## 2014-06-30 MED ORDER — POTASSIUM CHLORIDE 10 MEQ/100ML IV SOLN
10.0000 meq | INTRAVENOUS | Status: AC
  Administered 2014-06-30 (×3): 10 meq via INTRAVENOUS
  Filled 2014-06-30 (×3): qty 100

## 2014-06-30 MED ORDER — INSULIN ASPART 100 UNIT/ML ~~LOC~~ SOLN
3.0000 [IU] | Freq: Three times a day (TID) | SUBCUTANEOUS | Status: DC
  Administered 2014-06-30 – 2014-07-03 (×9): 3 [IU] via SUBCUTANEOUS

## 2014-06-30 MED ORDER — MAGNESIUM SULFATE 2 GM/50ML IV SOLN
2.0000 g | Freq: Once | INTRAVENOUS | Status: AC
  Administered 2014-06-30: 2 g via INTRAVENOUS
  Filled 2014-06-30: qty 50

## 2014-06-30 MED ORDER — ASPIRIN EC 81 MG PO TBEC
81.0000 mg | DELAYED_RELEASE_TABLET | Freq: Every day | ORAL | Status: DC
  Administered 2014-06-30 – 2014-07-04 (×5): 81 mg via ORAL
  Filled 2014-06-30 (×5): qty 1

## 2014-06-30 MED ORDER — POTASSIUM CHLORIDE CRYS ER 20 MEQ PO TBCR
40.0000 meq | EXTENDED_RELEASE_TABLET | Freq: Two times a day (BID) | ORAL | Status: AC
  Administered 2014-06-30 (×2): 40 meq via ORAL
  Filled 2014-06-30 (×2): qty 2

## 2014-06-30 MED ORDER — SODIUM CHLORIDE 0.9 % IV SOLN
INTRAVENOUS | Status: AC
  Administered 2014-06-30: 19:00:00 via INTRAVENOUS

## 2014-06-30 MED ORDER — ALPRAZOLAM 0.25 MG PO TABS: 0.2500 mg | ORAL_TABLET | Freq: Every evening | ORAL | Status: DC | PRN

## 2014-06-30 MED ORDER — ALUM & MAG HYDROXIDE-SIMETH 200-200-20 MG/5ML PO SUSP
30.0000 mL | Freq: Four times a day (QID) | ORAL | Status: DC | PRN
  Administered 2014-06-30: 30 mL via ORAL
  Filled 2014-06-30: qty 30

## 2014-06-30 MED ORDER — MAGNESIUM SULFATE 4 GM/100ML IV SOLN
4.0000 g | Freq: Once | INTRAVENOUS | Status: DC
  Filled 2014-06-30: qty 100

## 2014-06-30 MED ORDER — ATORVASTATIN CALCIUM 20 MG PO TABS
20.0000 mg | ORAL_TABLET | Freq: Every day | ORAL | Status: DC
  Administered 2014-06-30 – 2014-07-04 (×5): 20 mg via ORAL
  Filled 2014-06-30 (×5): qty 1

## 2014-06-30 MED ORDER — INSULIN ASPART 100 UNIT/ML ~~LOC~~ SOLN
0.0000 [IU] | Freq: Every day | SUBCUTANEOUS | Status: DC
  Administered 2014-06-30: 2 [IU] via SUBCUTANEOUS

## 2014-06-30 MED ORDER — INSULIN ASPART 100 UNIT/ML ~~LOC~~ SOLN
0.0000 [IU] | Freq: Three times a day (TID) | SUBCUTANEOUS | Status: DC
  Administered 2014-06-30: 5 [IU] via SUBCUTANEOUS
  Administered 2014-06-30: 7 [IU] via SUBCUTANEOUS
  Administered 2014-07-01: 2 [IU] via SUBCUTANEOUS
  Administered 2014-07-01: 3 [IU] via SUBCUTANEOUS

## 2014-06-30 MED ORDER — INSULIN ASPART PROT & ASPART (70-30 MIX) 100 UNIT/ML ~~LOC~~ SUSP
40.0000 [IU] | Freq: Two times a day (BID) | SUBCUTANEOUS | Status: DC
Start: 2014-06-30 — End: 2014-07-01
  Administered 2014-06-30 – 2014-07-01 (×2): 40 [IU] via SUBCUTANEOUS
  Filled 2014-06-30: qty 10

## 2014-06-30 NOTE — Progress Notes (Signed)
Inpatient Diabetes Program Recommendations  AACE/ADA: New Consensus Statement on Inpatient Glycemic Control (2013)  Target Ranges:  Prepandial:   less than 140 mg/dL      Peak postprandial:   less than 180 mg/dL (1-2 hours)      Critically ill patients:  140 - 180 mg/dL   Results for Christy Swanson, Christy Swanson (MRN [id number]) as of 06/30/2014 10:47  Ref. Range 06/29/2014 20:10 06/29/2014 22:44 06/30/2014 04:35  Glucose-Capillary Latest Ref Range: 65-99 mg/dL 287 (H) 681 (H) 157 (H)   Diabetes history: DM2 Outpatient Diabetes medications: 70/30 50 units with breakfast, 70/30 40 units with supper Current orders for Inpatient glycemic control: Novolog 0-9 units TID with meals, Novolog 0-5 units HS, Novolog 3 units TID with meals  Inpatient Diabetes Program Recommendations Insulin - Basal: Please consider ordering 70/30 15 units BID. Insulin - Meal Coverage: If 70/30 is ordered as requested, please discontinue Novolog meal coverage. HgbA1C: Please consider ordering an A1C to evaluate glycemic control over the past 2-3 months.  Thanks, Orlando Penner, RN, MSN, CCRN, CDE Diabetes Coordinator Inpatient Diabetes Program [phone] (Team Pager from 8am to 5pm) [phone] ([place] office) [phone] [facility] office) [phone] [facility] office)

## 2014-06-30 NOTE — Evaluation (Signed)
Clinical/Bedside Swallow Evaluation Patient Details  Name: Christy Swanson MRN: [id number] Date of Birth: Mar 04, 1931  Today's Date: 06/30/2014 Time: SLP Start Time (ACUTE ONLY): 3838 SLP Stop Time (ACUTE ONLY): 0828 SLP Time Calculation (min) (ACUTE ONLY): 16 min  Past Medical History:  Past Medical History  Diagnosis Date  . Diabetes mellitus without complication   . Hypertension   . Coronary artery disease   . Arthritis   . CHF (congestive heart failure)    Past Surgical History:  Past Surgical History  Procedure Laterality Date  . Coronary artery bypass graft     HPI:  81 y.o. female with Past medical history of diabetes mellitus, hypertension, coronary artery disease, chronic kidney disease, anemia, chronic diastolic dysfunction, obstructive sleep apnea on C Pap admitted with complaints of syncope. Per MD notes, pt was at restaurant and while she was drinking Coke she suddenly started choking and then passed out. CXR Mild bilateral atelectasis or scarring noted. Lungs otherwise clear. MRI No acute intracranial process.   Assessment / Plan / Recommendation Clinical Impression  Pt demonstrated oropharyngeal swallow abilities that are WFL's. No indications aspiration. Coughing intermittently prior, during and after po's. Pt denies difficulty swallowing prior to admission and denies esophageal deficits. Recommend continue regular texture (does not use dentures when eating) and thin liquids. Pills with thin liquid. No follow up needed.    Aspiration Risk  Mild    Diet Recommendation Thin (regular)   Medication Administration: Whole meds with liquid Compensations: Slow rate;Small sips/bites    Other  Recommendations Oral Care Recommendations: Oral care BID   Follow Up Recommendations       Frequency and Duration        Pertinent Vitals/Pain none         Swallow Study           Oral/Motor/Sensory Function Overall Oral Motor/Sensory Function: Appears within  functional limits for tasks assessed   Ice Chips Ice chips: Not tested   Thin Liquid Thin Liquid: Within functional limits Presentation: Cup;Straw    Nectar Thick Nectar Thick Liquid: Within functional limits   Honey Thick Honey Thick Liquid: Within functional limits   Puree Puree: Within functional limits   Solid   GO    Solid: Within functional limits       Royce Macadamia 06/30/2014,8:44 AM  Breck Coons Lonell Face.Ed [employer] [phone]

## 2014-06-30 NOTE — Progress Notes (Signed)
Utilization Review completed. Rozlyn Yerby RN BSN CM 

## 2014-06-30 NOTE — Progress Notes (Signed)
Placed patient on CPAP on AUTO mode with min pressure of 5cmH2O and max pressure of 20cmH2O. Patient is tolerating well and RT will continue to monitor.

## 2014-06-30 NOTE — Progress Notes (Signed)
[facility] PROGRESS NOTE  Assessment/Plan: Syncope - Most likely vasovagal due to choking episode, her CT of the head was unremarkable. - MRI is pending, continue aspirin no events on telemetry. - Check a dysphagia diet, allow diet. - Troponins are negative., No events on telemetry.  Prolonged QT: - She's had a prolonged QT in the past but on admission it was worsened close to 600, there is most likely due to electrolytes, she's not on any QT prolonging medication, see below for further details.  Hypokalemia/hypomagnesemia: - Most likely causing a prolonged QT, replete these aggressively will reevaluate him in the afternoon.  Chronic kidney disease stage III: - Baseline creatinine around 1.3, will start on gentle hydration. - Hold Lasix  OSA on CPAP Continue C-peptide bedtime  Diabetes mellitus - Continue sliding scale insulin.  Essential hypertension, benign - Hold Lasix for now.   S/P CABG (coronary artery bypass graft)     Code Status: full Family Communication: none  Disposition Plan: inpatient   Consultants:  none  Procedures:  CT headMRI  Antibiotics:  none     HPI/Subjective: She is hungry has no complians  Objective: Filed Vitals:   06/30/14 0022 06/30/14 0024 06/30/14 0026 06/30/14 0421  BP: 98/62 137/69 111/59 127/91  Pulse: 70 60 56 61  Temp: 98.3 F (36.8 C)   98.2 F (36.8 C)  TempSrc: Oral   Oral  Resp: 18   18  Height:      Weight:    91 kg (200 lb 9.9 oz)  SpO2: 96%   98%    Intake/Output Summary (Last 24 hours) at 06/30/14 0727 Last data filed at 06/30/14 0000  Gross per 24 hour  Intake    200 ml  Output      0 ml  Net    200 ml   Filed Weights   06/29/14 1639 06/30/14 0421  Weight: 90.719 kg (200 lb) 91 kg (200 lb 9.9 oz)    Exam:  General: Alert, awake, oriented x3, in no acute distress.  HEENT: No bruits, no goiter.  Heart: Regular rate and rhythm. Lungs: Good air movement,cler\ar Abdomen: Soft,  nontender, nondistended, positive bowel sounds.  Neuro: Grossly intact, nonfocal.   Data Reviewed: Basic Metabolic Panel:  Recent Labs Lab 06/29/14 1712 06/29/14 2217 06/30/14 0440  NA 133* 136 134*  K 2.2* <2.0* 2.4*  CL 84* 83* 84*  CO2 37* 38* 36*  GLUCOSE 220* 160* 225*  BUN 39* 34* 34*  CREATININE 1.68* 1.60* 1.61*  CALCIUM 10.0 10.1 9.4  MG 1.4* 1.4* 1.6*   Liver Function Tests:  Recent Labs Lab 06/29/14 1712 06/29/14 2217 06/30/14 0440  AST ALT 12* 11* 10*  ALKPHOS 73 62 69  BILITOT 0.5 0.8 0.7  PROT 8.4* 7.3 7.0  ALBUMIN 4.2 3.7 3.6   No results for input(s): LIPASE, AMYLASE in the last 168 hours. No results for input(s): AMMONIA in the last 168 hours. CBC:  Recent Labs Lab 06/29/14 1712 06/30/14 0440  WBC 8.7 10.4  NEUTROABS 4.6 5.9  HGB 14.2 13.2  HCT 41.5 38.1  MCV 87.0 86.6  PLT 246 202   Cardiac Enzymes:  Recent Labs Lab 06/29/14 1712 06/29/14 2217 06/30/14 0429  TROPONINI <0.03 <0.03 <0.03   BNP (last 3 results) No results for input(s): BNP in the last 8760 hours.  ProBNP (last 3 results)  Recent Labs  08/20/13 1031  PROBNP 1444.0*    CBG:  Recent Labs Lab 06/29/14  2010 06/29/14 2244 06/30/14 0435  GLUCAP 144* 157* 227*    No results found for this or any previous visit (from the past 240 hour(s)).   Studies: Dg Chest 2 View  06/29/2014   CLINICAL DATA:  Acute onset of dizziness. Choked on water. Initial encounter.  EXAM: CHEST  2 VIEW  COMPARISON:  Chest radiograph performed 08/20/2013  FINDINGS: The lungs are well-aerated. Mild bilateral atelectasis or scarring is noted. The lungs are otherwise clear. There is no evidence of pleural effusion or pneumothorax.  The heart is borderline normal in size. The patient is status post median sternotomy. No acute osseous abnormalities are seen.  IMPRESSION: Mild bilateral atelectasis or scarring noted. Lungs otherwise clear.   Electronically Signed   By: Roanna Raider M.D.   On: 06/29/2014 17:59   Ct Head Wo Contrast  06/29/2014   CLINICAL DATA:  Cough, syncope, denies headache and dizziness  EXAM: CT HEAD WITHOUT CONTRAST  TECHNIQUE: Contiguous axial images were obtained from the base of the skull through the vertex without intravenous contrast.  COMPARISON:  08/18/2013  FINDINGS: There is no evidence of mass effect, midline shift, or extra-axial fluid collections. There is no evidence of a space-occupying lesion or intracranial hemorrhage. There is no evidence of a cortical-based area of acute infarction. There is generalized cerebral atrophy. There is periventricular white matter low attenuation likely secondary to microangiopathy.  The ventricles and sulci are appropriate for the patient's age. The basal cisterns are patent.  Incidental note is made of left globe prosthesis. The right orbit is normal. The visualized portions of the paranasal sinuses and mastoid air cells are unremarkable. Cerebrovascular atherosclerotic calcifications are noted.  The osseous structures are unremarkable.  IMPRESSION: 1. No acute intracranial pathology. 2. Chronic microvascular disease and cerebral atrophy.   Electronically Signed   By: Elige Ko   On: 06/29/2014 17:57   Mri Brain Without Contrast  06/30/2014   CLINICAL DATA:  Syncope. History of diabetes, hypertension, heart failure, kidney disease.  EXAM: MRI HEAD WITHOUT CONTRAST  TECHNIQUE: Multiplanar, multiecho pulse sequences of the brain and surrounding structures were obtained without intravenous contrast.  COMPARISON:  CT head June 29, 2014  FINDINGS: The ventricles and sulci are normal for patient's age. No suspicious parenchymal signal, mass lesions, mass effect. Patchy supratentorial white matter FLAIR T2 hyperintensities, less than expected for age. No reduced diffusion to suggest acute ischemia. No susceptibility artifact to suggest hemorrhage. Old small RIGHT cerebellar infarcts.  No abnormal extra-axial fluid  collections. No extra-axial masses though, contrast enhanced sequences would be more sensitive. Normal major intracranial vascular flow voids seen at the skull base.  LEFT globe prosthesis and atrophic LEFT optic nerve. Status post RIGHT ocular lens implant. No abnormal sellar expansion. Visualized paranasal sinuses and mastoid air cells are well-aerated. No suspicious calvarial bone marrow signal. No abnormal sellar expansion. Craniocervical junction maintained. Patient is edentulous.  IMPRESSION: No acute intracranial process.  Involutional changes. Mild white matter changes compatible chronic small vessel ischemic disease. 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   Electronically Signed   By: Awilda Metro M.D.   On: 06/30/2014 04:23    Scheduled Meds: . heparin  5,000 Units Subcutaneous 3 times per day  . insulin aspart  0-15 Units Subcutaneous Q6H  . magnesium sulfate 1 - 4 g bolus IVPB  2 g Intravenous Once  . pantoprazole (PROTONIX) IV  40 mg Intravenous Q24H  . potassium chloride  10 mEq Intravenous Q1 Hr x 3  .  sodium chloride  3 mL Intravenous Q12H   Continuous Infusions:   Time Spent: 25 min   Marinda Elk  [facility] Pager [phone]. If 7PM-7AM, please contact night-coverage at [email or web address], password [facility] 06/30/2014, 7:27 AM  LOS: 1 day

## 2014-06-30 NOTE — Progress Notes (Signed)
Placed patient on CPAP with no complications, patient is tolerating it well and RT will continue to monitor.

## 2014-07-01 DIAGNOSIS — E876 Hypokalemia: Secondary | ICD-10-CM

## 2014-07-01 DIAGNOSIS — R55 Syncope and collapse: Principal | ICD-10-CM

## 2014-07-01 DIAGNOSIS — I10 Essential (primary) hypertension: Secondary | ICD-10-CM

## 2014-07-01 LAB — BASIC METABOLIC PANEL
ANION GAP: 10 (ref 5–15)
BUN: 22 mg/dL — AB (ref 6–20)
CO2: 31 mmol/L (ref 22–32)
Calcium: 9 mg/dL (ref 8.9–10.3)
Chloride: 94 mmol/L — ABNORMAL LOW (ref 101–111)
Creatinine, Ser: 1.41 mg/dL — ABNORMAL HIGH (ref 0.44–1.00)
GFR calc non Af Amer: 34 mL/min — ABNORMAL LOW (ref >60)
GFR, EST AFRICAN AMERICAN: 40 mL/min — AB (ref >60)
Glucose, Bld: 290 mg/dL — ABNORMAL HIGH (ref 65–99)
Potassium: 3.6 mmol/L (ref 3.5–5.1)
Sodium: 135 mmol/L (ref 135–145)

## 2014-07-01 LAB — HEMOGLOBIN A1C
Hgb A1c MFr Bld: 9 % — ABNORMAL HIGH (ref 4.8–5.6)
MEAN PLASMA GLUCOSE: 212 mg/dL

## 2014-07-01 LAB — GLUCOSE, CAPILLARY
GLUCOSE-CAPILLARY: 249 mg/dL — AB (ref 65–99)
GLUCOSE-CAPILLARY: 282 mg/dL — AB (ref 65–99)
GLUCOSE-CAPILLARY: 161 mg/dL — AB (ref 65–99)
Glucose-Capillary: 160 mg/dL — ABNORMAL HIGH (ref 65–99)

## 2014-07-01 MED ORDER — ALPRAZOLAM 0.25 MG PO TABS
0.2500 mg | ORAL_TABLET | Freq: Two times a day (BID) | ORAL | Status: DC
  Administered 2014-07-01 – 2014-07-04 (×7): 0.25 mg via ORAL
  Filled 2014-07-01 (×7): qty 1

## 2014-07-01 MED ORDER — INSULIN ASPART PROT & ASPART (70-30 MIX) 100 UNIT/ML ~~LOC~~ SUSP
60.0000 [IU] | Freq: Every day | SUBCUTANEOUS | Status: DC
  Administered 2014-07-02 – 2014-07-03 (×2): 60 [IU] via SUBCUTANEOUS
  Filled 2014-07-01: qty 10

## 2014-07-01 MED ORDER — POTASSIUM CHLORIDE CRYS ER 20 MEQ PO TBCR: 40.0000 meq | EXTENDED_RELEASE_TABLET | ORAL | Status: DC

## 2014-07-01 MED ORDER — INSULIN ASPART PROT & ASPART (70-30 MIX) 100 UNIT/ML ~~LOC~~ SUSP
40.0000 [IU] | Freq: Every day | SUBCUTANEOUS | Status: DC
  Administered 2014-07-02: 40 [IU] via SUBCUTANEOUS
  Filled 2014-07-01: qty 10

## 2014-07-01 MED ORDER — PANTOPRAZOLE SODIUM 40 MG PO TBEC
40.0000 mg | DELAYED_RELEASE_TABLET | Freq: Every day | ORAL | Status: DC
  Administered 2014-07-01 – 2014-07-04 (×4): 40 mg via ORAL
  Filled 2014-07-01 (×4): qty 1

## 2014-07-01 NOTE — Evaluation (Signed)
Physical Therapy Evaluation Patient Details Name: Christy Swanson MRN: [id number] DOB: 03-06-1931 Today's Date: 07/01/2014   History of Present Illness  Pt adm with syncope after choking episode. PMH - DM, HTN, CABG, blind  Clinical Impression  Pt admitted with above diagnosis and presents to PT with functional limitations due to deficits listed below (See PT problem list). Pt needs skilled PT to maximize independence and safety to allow discharge to home with family.      Follow Up Recommendations Home health PT;Supervision for mobility/OOB    Equipment Recommendations  None recommended by PT    Recommendations for Other Services       Precautions / Restrictions Precautions Precautions: Fall Precaution Comments: blind      Mobility  Bed Mobility                  Transfers Overall transfer level: Needs assistance Equipment used: Straight cane Transfers: Sit to/from Stand;Stand Pivot Transfers Sit to Stand: Min assist Stand pivot transfers: Min assist       General transfer comment: Assist for balance and guidance  Ambulation/Gait Ambulation/Gait assistance: Min assist Ambulation Distance (Feet): 200 Feet Assistive device: Straight cane;1 person hand held assist Gait Pattern/deviations: Step-through pattern;Decreased stride length     General Gait Details: Assist for balance and guidance due to blindness  Stairs            Wheelchair Mobility    Modified Rankin (Stroke Patients Only)       Balance Overall balance assessment: Needs assistance;History of Falls Sitting-balance support: No upper extremity supported Sitting balance-Leahy Scale: Good     Standing balance support: Single extremity supported Standing balance-Leahy Scale: Poor                               Pertinent Vitals/Pain Pain Assessment: No/denies pain    Home Living Family/patient expects to be discharged to:: Private residence Living Arrangements:  Children;Spouse/significant other Available Help at Discharge: Available 24 hours/day Type of Home: House Home Access: Level entry     Home Layout: One level Home Equipment: Cane - single point      Prior Function Level of Independence: Needs assistance   Gait / Transfers Assistance Needed: amb with cane. Assist for unfamiliar surroundings            Hand Dominance        Extremity/Trunk Assessment   Upper Extremity Assessment: Generalized weakness           Lower Extremity Assessment: Generalized weakness         Communication   Communication: No difficulties  Cognition Arousal/Alertness: Awake/alert Behavior During Therapy: WFL for tasks assessed/performed Overall Cognitive Status: Within Functional Limits for tasks assessed                      General Comments      Exercises        Assessment/Plan    PT Assessment Patient needs continued PT services  PT Diagnosis Difficulty walking;Generalized weakness   PT Problem List Decreased strength;Decreased activity tolerance;Decreased balance;Decreased mobility  PT Treatment Interventions DME instruction;Gait training;Functional mobility training;Therapeutic activities;Patient/family education;Therapeutic exercise;Balance training   PT Goals (Current goals can be found in the Care Plan section) Acute Rehab PT Goals Patient Stated Goal: return home PT Goal Formulation: With patient Time For Goal Achievement: 07/08/14 Potential to Achieve Goals: Good    Frequency Min 3X/week   Barriers to  discharge        Co-evaluation               End of Session Equipment Utilized During Treatment: Gait belt Activity Tolerance: Patient tolerated treatment well Patient left: in chair;with call bell/phone within reach           Time: 1520-1540 PT Time Calculation (min) (ACUTE ONLY): 20 min   Charges:   PT Evaluation $Initial PT Evaluation Tier I: 1 Procedure     PT G Codes:         Christy Swanson 2014-07-10, 4:00 PM  [facility] PT [phone]

## 2014-07-01 NOTE — Progress Notes (Addendum)
[facility] PROGRESS NOTE  Assessment/Plan: Syncope - Most likely vasovagal due to a choking episode, her CT of the head was unremarkable. - MRI negative for acute abnormalities  - continue aspirin  - no events on telemetry. - Troponins are negative., No events on telemetry.  Prolonged QT: - She's had a prolonged QT in the past but on admission it was worsened close to 600,  most likely due to electrolytes, she's not on any QT prolonging medication - Qtc 451 now   Hypokalemia/hypomagnesemia: - Most likely causing a prolonged QT -Repleted  Chronic kidney disease stage III: - Baseline creatinine around 1.3- presented creatinine of 1.68 - Improved with holding Lasix (on 80 mg daily) and hydration  OSA on CPAP Continue C-peptide bedtime  Diabetes mellitus - Continue NovoLog 70/30-increased to 60 units in the morning as she takes at home-  Essential hypertension, benign - Hold Lasix for now-follow BP    S/P CABG (coronary artery bypass graft) -continue aspirin    Code Status: full Family Communication: none  Disposition Plan: inpatient   Consultants:  none  Procedures:  CT headMRI  Antibiotics:  none     HPI/Subjective: The patient has no complaints.  Objective: Filed Vitals:   07/01/14 0206 07/01/14 0518 07/01/14 1011 07/01/14 1414  BP: 128/46 111/41 131/48 152/72  Pulse: 58 56 63 57  Temp: 98.3 F (36.8 C) 98.3 F (36.8 C) 98.3 F (36.8 C) 98.6 F (37 C)  TempSrc: Oral Oral Oral Oral  Resp: Height:      Weight:  90.493 kg (199 lb 8 oz)    SpO2: 95% 94% 99% 100%    Intake/Output Summary (Last 24 hours) at 07/01/14 1634 Last data filed at 07/01/14 1350  Gross per 24 hour  Intake  922.5 ml  Output   1750 ml  Net -827.5 ml   Filed Weights   06/29/14 1639 06/30/14 0421 07/01/14 0518  Weight: 90.719 kg (200 lb) 91 kg (200 lb 9.9 oz) 90.493 kg (199 lb 8 oz)    Exam:  General: Alert, awake, oriented x3, in no acute  distress.  HEENT: No bruits, no goiter.  Heart: Regular rate and rhythm. Lungs: Good air movement,cler\ar Abdomen: Soft, nontender, nondistended, positive bowel sounds.  Neuro: Grossly intact, nonfocal.   Data Reviewed: Basic Metabolic Panel:  Recent Labs Lab 06/29/14 1712 06/29/14 2217 06/30/14 0440 06/30/14 1537 06/30/14 1920 07/01/14 1524  NA 133* 136 134*  --  134* 135  K 2.2* <2.0* 2.4*  --  3.2* 3.6  CL 84* 83* 84*  --  86* 94*  CO2 37* 38* 36*  --  33* 31  GLUCOSE 220* 160* 225*  --  276* 290*  BUN 39* 34* 34*  --  28* 22*  CREATININE 1.68* 1.60* 1.61*  --  1.35* 1.41*  CALCIUM 10.0 10.1 9.4  --  9.6 9.0  MG 1.4* 1.4* 1.6* 2.6*  --   --    Liver Function Tests:  Recent Labs Lab 06/29/14 1712 06/29/14 2217 06/30/14 0440  AST ALT 12* 11* 10*  ALKPHOS 73 62 69  BILITOT 0.5 0.8 0.7  PROT 8.4* 7.3 7.0  ALBUMIN 4.2 3.7 3.6   No results for input(s): LIPASE, AMYLASE in the last 168 hours. No results for input(s): AMMONIA in the last 168 hours. CBC:  Recent Labs Lab 06/29/14 1712 06/30/14 0440  WBC 8.7 10.4  NEUTROABS 4.6 5.9  HGB 14.2 13.2  HCT 41.5 38.1  MCV 87.0 86.6  PLT 246 202   Cardiac Enzymes:  Recent Labs Lab 06/29/14 1712 06/29/14 2217 06/30/14 0429 06/30/14 1050  TROPONINI <0.03 <0.03 <0.03 <0.03   BNP (last 3 results) No results for input(s): BNP in the last 8760 hours.  ProBNP (last 3 results)  Recent Labs  08/20/13 1031  PROBNP 1444.0*    CBG:  Recent Labs Lab 06/30/14 1147 06/30/14 1629 06/30/14 2141 07/01/14 0759 07/01/14 1135  GLUCAP 348* 279* 227* 160* 249*    No results found for this or any previous visit (from the past 240 hour(s)).   Studies: Dg Chest 2 View  06/29/2014   CLINICAL DATA:  Acute onset of dizziness. Choked on water. Initial encounter.  EXAM: CHEST  2 VIEW  COMPARISON:  Chest radiograph performed 08/20/2013  FINDINGS: The lungs are well-aerated. Mild bilateral atelectasis or  scarring is noted. The lungs are otherwise clear. There is no evidence of pleural effusion or pneumothorax.  The heart is borderline normal in size. The patient is status post median sternotomy. No acute osseous abnormalities are seen.  IMPRESSION: Mild bilateral atelectasis or scarring noted. Lungs otherwise clear.   Electronically Signed   By: Roanna Raider M.D.   On: 06/29/2014 17:59   Ct Head Wo Contrast  06/29/2014   CLINICAL DATA:  Cough, syncope, denies headache and dizziness  EXAM: CT HEAD WITHOUT CONTRAST  TECHNIQUE: Contiguous axial images were obtained from the base of the skull through the vertex without intravenous contrast.  COMPARISON:  08/18/2013  FINDINGS: There is no evidence of mass effect, midline shift, or extra-axial fluid collections. There is no evidence of a space-occupying lesion or intracranial hemorrhage. There is no evidence of a cortical-based area of acute infarction. There is generalized cerebral atrophy. There is periventricular white matter low attenuation likely secondary to microangiopathy.  The ventricles and sulci are appropriate for the patient's age. The basal cisterns are patent.  Incidental note is made of left globe prosthesis. The right orbit is normal. The visualized portions of the paranasal sinuses and mastoid air cells are unremarkable. Cerebrovascular atherosclerotic calcifications are noted.  The osseous structures are unremarkable.  IMPRESSION: 1. No acute intracranial pathology. 2. Chronic microvascular disease and cerebral atrophy.   Electronically Signed   By: Elige Ko   On: 06/29/2014 17:57   Mri Brain Without Contrast  06/30/2014   CLINICAL DATA:  Syncope. History of diabetes, hypertension, heart failure, kidney disease.  EXAM: MRI HEAD WITHOUT CONTRAST  TECHNIQUE: Multiplanar, multiecho pulse sequences of the brain and surrounding structures were obtained without intravenous contrast.  COMPARISON:  CT head June 29, 2014  FINDINGS: The ventricles and  sulci are normal for patient's age. No suspicious parenchymal signal, mass lesions, mass effect. Patchy supratentorial white matter FLAIR T2 hyperintensities, less than expected for age. No reduced diffusion to suggest acute ischemia. No susceptibility artifact to suggest hemorrhage. Old small RIGHT cerebellar infarcts.  No abnormal extra-axial fluid collections. No extra-axial masses though, contrast enhanced sequences would be more sensitive. Normal major intracranial vascular flow voids seen at the skull base.  LEFT globe prosthesis and atrophic LEFT optic nerve. Status post RIGHT ocular lens implant. No abnormal sellar expansion. Visualized paranasal sinuses and mastoid air cells are well-aerated. No suspicious calvarial bone marrow signal. No abnormal sellar expansion. Craniocervical junction maintained. Patient is edentulous.  IMPRESSION: No acute intracranial process.  Involutional changes. Mild white matter changes compatible chronic small vessel ischemic disease. 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  Electronically Signed   By: Awilda Metro M.D.   On: 06/30/2014 04:23    Scheduled Meds: . ALPRAZolam  0.25 mg Oral BID  . aspirin EC  81 mg Oral Daily  . atorvastatin  20 mg Oral Daily  . heparin  5,000 Units Subcutaneous 3 times per day  . insulin aspart  0-5 Units Subcutaneous QHS  . insulin aspart  0-9 Units Subcutaneous TID WC  . insulin aspart  3 Units Subcutaneous TID WC  . insulin aspart protamine- aspart  40 Units Subcutaneous BID WC  . pantoprazole  40 mg Oral Daily  . potassium chloride  40 mEq Oral Q4H  . sodium chloride  3 mL Intravenous Q12H   Continuous Infusions:   Time Spent: 25 min   Taja Pentland  [facility] 07/01/2014, 4:34 PM  LOS: 2 days

## 2014-07-01 NOTE — Progress Notes (Signed)
Patient states that she lives in [place], in [place]. In town with family. Patient states that she lives with husband and that her daughter moved in with them when she started to get too hard for husband to care for om his own. Patient is legally blind and uses a cane to walk, patient denies needing any other equipment and feels safe at home. Patient states that she has home health "RN" that comes to help with her medicine but is not sure of the company. Patient also shared that she [facility] a [facility].

## 2014-07-01 NOTE — Clinical Documentation Improvement (Signed)
In beginning paragraph as review of past medical problems, diastolic dysfunction is listed.  In past medical history it is listed as CHF.  In active problems - it is stated that currently with her history of CHF, hold aggressive IV hydration.  Would you please help clarify medical condition related to clinical documentation?  Chronic diastolic CHF Chronic systolic CHF Unable to determine Other clinical explanation   Thank You, Harrie Jeans ,RN Clinical Documentation Specialist:    [facility] Health Information Management [phone] Cell - [phone]

## 2014-07-01 NOTE — Progress Notes (Signed)
Inpatient Diabetes Program Recommendations  AACE/ADA: New Consensus Statement on Inpatient Glycemic Control (2013)  Target Ranges:  Prepandial:   less than 140 mg/dL      Peak postprandial:   less than 180 mg/dL (1-2 hours)      Critically ill patients:  140 - 180 mg/dL   Results for Christy Swanson, Christy Swanson (MRN [id number]) as of 07/01/2014 11:05  Ref. Range 06/30/2014 04:35 06/30/2014 11:47 06/30/2014 16:29 06/30/2014 21:41 07/01/2014 07:59  Glucose-Capillary Latest Ref Range: 65-99 mg/dL 638 (H) 177 (H) 116 (H) 227 (H) 160 (H)   Diabetes history: DM2 Outpatient Diabetes medications: 70/30 50 units with breakfast, 70/30 40 units with supper Current orders for Inpatient glycemic control: 70/30 40 units BID, Novolog 0-9 units TID with meals, Novolog 0-5 units HS, Novolog 3 units TID with meals  Inpatient Diabetes Program Recommendations Insulin - Meal Coverage: Since 70/30 was resumed, please consider discontinuing Novolog 3 units TID meal coverage since 70/30 has regular insulin to work as meal coverage.   Thanks, Orlando Penner, RN, MSN, CCRN, CDE Diabetes Coordinator Inpatient Diabetes Program [phone] (Team Pager from 8am to 5pm) [phone] ([facility] office) [phone] [facility] office) [phone] [facility] office)

## 2014-07-02 DIAGNOSIS — I4581 Long QT syndrome: Secondary | ICD-10-CM

## 2014-07-02 LAB — GLUCOSE, CAPILLARY
GLUCOSE-CAPILLARY: 221 mg/dL — AB (ref 65–99)
Glucose-Capillary: 154 mg/dL — ABNORMAL HIGH (ref 65–99)
Glucose-Capillary: 203 mg/dL — ABNORMAL HIGH (ref 65–99)
Glucose-Capillary: 101 mg/dL — ABNORMAL HIGH (ref 65–99)

## 2014-07-02 LAB — BASIC METABOLIC PANEL
ANION GAP: 10 (ref 5–15)
Anion gap: 9 (ref 5–15)
BUN: 16 mg/dL (ref 6–20)
BUN: 16 mg/dL (ref 6–20)
CALCIUM: 9.3 mg/dL (ref 8.9–10.3)
CO2: 33 mmol/L — ABNORMAL HIGH (ref 22–32)
CO2: 34 mmol/L — ABNORMAL HIGH (ref 22–32)
CREATININE: 1.14 mg/dL — AB (ref 0.44–1.00)
Calcium: 8.8 mg/dL — ABNORMAL LOW (ref 8.9–10.3)
Chloride: 93 mmol/L — ABNORMAL LOW (ref 101–111)
Chloride: 94 mmol/L — ABNORMAL LOW (ref 101–111)
Creatinine, Ser: 1.15 mg/dL — ABNORMAL HIGH (ref 0.44–1.00)
GFR calc Af Amer: 51 mL/min — ABNORMAL LOW (ref >60)
GFR calc non Af Amer: 44 mL/min — ABNORMAL LOW (ref >60)
GFR, EST AFRICAN AMERICAN: 51 mL/min — AB (ref >60)
GFR, EST NON AFRICAN AMERICAN: 44 mL/min — AB (ref >60)
Glucose, Bld: 198 mg/dL — ABNORMAL HIGH (ref 65–99)
Glucose, Bld: 180 mg/dL — ABNORMAL HIGH (ref 65–99)
POTASSIUM: 2.7 mmol/L — AB (ref 3.5–5.1)
Potassium: 3.3 mmol/L — ABNORMAL LOW (ref 3.5–5.1)
Sodium: 136 mmol/L (ref 135–145)
Sodium: 137 mmol/L (ref 135–145)

## 2014-07-02 LAB — MAGNESIUM: MAGNESIUM: 2 mg/dL (ref 1.7–2.4)

## 2014-07-02 MED ORDER — MENTHOL 3 MG MT LOZG
1.0000 | LOZENGE | OROMUCOSAL | Status: DC
  Administered 2014-07-02 – 2014-07-04 (×13): 3 mg via ORAL
  Filled 2014-07-02 (×3): qty 9

## 2014-07-02 MED ORDER — SODIUM CHLORIDE 0.9 % IV SOLN: Freq: Once | INTRAVENOUS | Status: DC

## 2014-07-02 MED ORDER — POTASSIUM CHLORIDE CRYS ER 20 MEQ PO TBCR
40.0000 meq | EXTENDED_RELEASE_TABLET | ORAL | Status: DC | PRN
  Filled 2014-07-02: qty 2

## 2014-07-02 MED ORDER — SODIUM CHLORIDE 0.9 % IV SOLN
INTRAVENOUS | Status: DC
  Administered 2014-07-02: 19:00:00 via INTRAVENOUS

## 2014-07-02 MED ORDER — POTASSIUM CHLORIDE 10 MEQ/100ML IV SOLN
10.0000 meq | INTRAVENOUS | Status: AC
  Administered 2014-07-02 (×4): 10 meq via INTRAVENOUS
  Filled 2014-07-02 (×5): qty 100

## 2014-07-02 MED ORDER — SODIUM CHLORIDE 0.9 % IV BOLUS (SEPSIS)
500.0000 mL | Freq: Once | INTRAVENOUS | Status: AC
  Administered 2014-07-02: 500 mL via INTRAVENOUS

## 2014-07-02 NOTE — Progress Notes (Signed)
MD Rizwan paged, patient K+ 2.7, asked for intervention.

## 2014-07-02 NOTE — Progress Notes (Signed)
RT placed pt on auto titrate CPAP with 4Lpm of oxygen bled in via nasal mask. Pt states she is comfortable and is tolerating CPAP well at this time. RT will continue to monitor as needed.

## 2014-07-02 NOTE — Progress Notes (Signed)
Results for RYDER, LONSDALE (MRN [id number]) as of 07/02/2014 13:01  Ref. Range 07/01/2014 11:35 07/01/2014 17:29 07/01/2014 21:41 07/02/2014 08:05 07/02/2014 11:58  Glucose-Capillary Latest Ref Range: 65-99 mg/dL 147 (H) 829 (H) 562 (H) 221 (H) 154 (H)  Recommend discontinuing the Novolog 3 units TID as meal coverage. Recommend adding Novolog SENSITIVE correction scale TID & HS in order to correct the premeal CBG.  The short-acting insulin included in the 70/30 mixed insulin can act as a meal coverage. Will continue to follow while in hospital. Smith Mince RN BSN CDE

## 2014-07-02 NOTE — Progress Notes (Signed)
Pt placed on nasal Cpap no issues to report

## 2014-07-02 NOTE — Progress Notes (Signed)
Spoke this am with paient. Discussed PT for home health. Patient not sure if she needs it. Encouraged patient to notify me when her daughter comes today so we can discuss discharge planning. Card left in room.

## 2014-07-02 NOTE — Progress Notes (Signed)
Christy Swanson Admit Date: 06/29/2014 07/02/2014 Arita Miss Requesting Physician:  Butler Denmark MD  Reason for Consult:  Hypokalemia HPI:  57F seen at request of Dr. Butler Denmark for evaluation of Hypokalemia.  Patient's pertinent past medical history includes hypertension, CAD, congestive heart failure (normal LVEF, grade 1 diastolic dysfunction present on 08/2013 TTE), CKD 3, obstructive sleep apnea.  She takes 40 mg of Lasix daily at home as well as 20 mEq potassium chloride. She was admitted after a syncopal episode during coughing when she choked on a food item. She was found to have a prolonged QTC with a potassium value less than 2 and a magnesium value of 1.4. She was repleted and the potassium improved to 3.6 on 07/01/14 but was 2.7 this morning. She denies any diarrhea and has not been on diuretics since admission. Her serum bicarbonate was also elevated at 39 when her potassium was low.   Nursing has demonstrated patient eating more than 50% of her meals. She is not on a renal diet. No documented diarrhea. No nausea or vomiting since admission. Urine output has been normal.Even when her potassium was 3.6 it was elevated at 31.   Currently she feels weak but denies any numbness or focal deficits. MRI of the brain on admission without any acute findings. She denies any previous known history of severe hypokalemia.  She has been normotensive / hypertensive.  POTASSIUM  Date Value  07/02/2014 2.7 mmol/L*  07/01/2014 3.6 mmol/L  06/30/2014 3.2 mmol/L*  06/30/2014 2.4 mmol/L*  06/29/2014 <2.0 mmol/L*  06/29/2014 2.2 mmol/L*  08/22/2013 3.2 mEq/L*  08/21/2013 3.5 mEq/L*  08/20/2013 4.0 mEq/L  08/19/2013 3.4 mEq/L*  ]   CREATININE, SER (mg/dL)  Date Value  16/10/9602 1.15*  07/01/2014 1.41*  06/30/2014 1.35*  06/30/2014 1.61*  06/29/2014 1.60*  06/29/2014 1.68*  08/22/2013 1.11*  08/21/2013 1.10  08/20/2013 2.16*  08/19/2013 4.78*  ] I/Os: I/O last 3 completed shifts: In: 1122  [P.O.:1122] Out: 3250 [Urine:2950; Stool:300]  ROS Balance of 12 systems is negative w/ exceptions as above  PMH  Past Medical History  Diagnosis Date  . Diabetes mellitus without complication   . Hypertension   . Coronary artery disease   . Arthritis   . CHF (congestive heart failure)    PSH  Past Surgical History  Procedure Laterality Date  . Coronary artery bypass graft     FH History reviewed. No pertinent family history. SH  reports that she has never smoked. She does not have any smokeless tobacco history on file. She reports that she does not drink alcohol. Her drug history is not on file. Allergies No Known Allergies Home medications Prior to Admission medications   Medication Sig Start Date End Date Taking? Authorizing Provider  ALPRAZolam (XANAX) 0.25 MG tablet Take 0.25 mg by mouth every other day.    Yes Historical Provider, MD  amLODipine (NORVASC) 5 MG tablet Take 5 mg by mouth daily.   Yes Historical Provider, MD  aspirin EC 81 MG tablet Take 1 tablet (81 mg total) by mouth daily. 08/22/13  Yes Elease Etienne, MD  atorvastatin (LIPITOR) 20 MG tablet Take 20 mg by mouth daily.   Yes Historical Provider, MD  carvedilol (COREG) 3.125 MG tablet Take 3.125 mg by mouth 2 (two) times daily with a meal.   Yes Historical Provider, MD  citalopram (CELEXA) 20 MG tablet Take 1 tablet (20 mg total) by mouth daily. 08/22/13  Yes Elease Etienne, MD  colchicine 0.6 MG tablet  Take 0.6 mg by mouth 2 (two) times daily as needed (for gout).   Yes Historical Provider, MD  donepezil (ARICEPT) 10 MG tablet Take 10 mg by mouth at bedtime.   Yes Historical Provider, MD  esomeprazole (NEXIUM) 40 MG capsule Take 40 mg by mouth 2 (two) times daily before a meal.    Yes Historical Provider, MD  furosemide (LASIX) 80 MG tablet Take 0.5 tablets (40 mg total) by mouth daily. Patient taking differently: Take 80 mg by mouth daily.  08/22/13  Yes Elease Etienne, MD  gabapentin (NEURONTIN) 300 MG  capsule Take 300 mg by mouth 2 (two) times daily.   Yes Historical Provider, MD  insulin NPH-regular Human (NOVOLIN 70/30) (70-30) 100 UNIT/ML injection Inject 40-60 Units into the skin 2 (two) times daily with a meal. 60 units q am, 40 units qpm Patient taking differently: Inject 40-50 Units into the skin 2 (two) times daily with a meal. 50 units q am, 40 units qpm 08/23/13  Yes Elease Etienne, MD  isosorbide mononitrate (IMDUR) 30 MG 24 hr tablet Take 30 mg by mouth 2 (two) times daily.    Yes Historical Provider, MD  nitroGLYCERIN (NITROSTAT) 0.4 MG SL tablet Place 1 tablet (0.4 mg total) under the tongue every 5 (five) minutes as needed for chest pain (upto 3 doses.). 08/22/13  Yes Elease Etienne, MD  potassium chloride SA (K-DUR,KLOR-CON) 20 MEQ tablet Take 20 mEq by mouth 2 (two) times daily.    Yes Historical Provider, MD  sucralfate (CARAFATE) 1 G tablet Take 1 g by mouth 3 (three) times daily.    Yes Historical Provider, MD    Current Medications Scheduled Meds: . ALPRAZolam  0.25 mg Oral BID  . aspirin EC  81 mg Oral Daily  . atorvastatin  20 mg Oral Daily  . heparin  5,000 Units Subcutaneous 3 times per day  . insulin aspart  3 Units Subcutaneous TID WC  . insulin aspart protamine- aspart  40 Units Subcutaneous Q supper  . insulin aspart protamine- aspart  60 Units Subcutaneous Q breakfast  . menthol-cetylpyridinium  1 lozenge Oral Q2H while awake  . pantoprazole  40 mg Oral Daily  . potassium chloride  10 mEq Intravenous Q1 Hr x 4  . sodium chloride  3 mL Intravenous Q12H   Continuous Infusions:  PRN Meds:.acetaminophen **OR** acetaminophen, alum & mag hydroxide-simeth, potassium chloride  CBC  Recent Labs Lab 06/29/14 1712 06/30/14 0440  WBC 8.7 10.4  NEUTROABS 4.6 5.9  HGB 14.2 13.2  HCT 41.5 38.1  MCV 87.0 86.6  PLT 246 202   Basic Metabolic Panel  Recent Labs Lab 06/29/14 1712 06/29/14 2217 06/30/14 0440 06/30/14 1920 07/01/14 1524 07/02/14 0701  NA  133* 136 134* 134* 135 136  K 2.2* <2.0* 2.4* 3.2* 3.6 2.7*  CL 84* 83* 84* 86* 94* 93*  CO2 37* 38* 36* 33* 31 33*  GLUCOSE 220* 160* 225* 276* 290* 198*  BUN 39* 34* 34* 28* 22* 16  CREATININE 1.68* 1.60* 1.61* 1.35* 1.41* 1.15*  CALCIUM 10.0 10.1 9.4 9.6 9.0 8.8*    Physical Exam  Blood pressure 121/51, pulse 57, temperature 98.4 F (36.9 C), temperature source Oral, resp. rate 18, height 5\' 2"  (1.575 m), weight 90.8 kg (200 lb 2.8 oz), SpO2 96 %. GEN:  NAD, obese, frail-appearing ENCAT  EYES:  blind in both eyes CV: RRR PULM: CTAB ABD: s/nt/nd SKIN: no rashes/lesions EXT:no LEE  Assessment/Plan 89F admitted with syncope  related to cough anf found to have severe hypokalemia, metabolic alkalosis.  Home meds include furosemide.  K with some improvement with repletion, but low today at 2.7.  1. Hypokalemia and Metabolic Alkalosis 1. Suspect at least partially, if not completely 2/2 loop diuretic 2. Worsening today suggests either: 1. Poor PO K intake -- but eating well 2. Other cause of inc urinary K excretion -  Hyperaldosteronism 3. No evidence of GI losses 3. Agree with IV repletion today 4. Will start 40 mEq PO today also 5. Send on spot Urine K and Cr 6. Obtain 24h Urine K and Cr and Na 7. If persists, add spironolactone -- but would check serum aldosterone and renin first 8. BID BMP 2. Mild CKD 3. CAD 4. Syncope, likely vasovagal 5. Hypomagnesemia   Sabra Heck MD [phone] pgr 07/02/2014, 11:33 AM

## 2014-07-02 NOTE — Progress Notes (Addendum)
[facility] PROGRESS NOTE     Hospital course: This is a 81 year old female with a history of hypertension, coronary artery disease, CKD 3, obstructive sleep apnea who is blind. She was admitted after passing out while choking on food and was found to have a severely low potassium level which was < 2.0 and low Mg+ of 1.4. Her Lasix was held.   Assessment/Plan: Syncope - Most likely vasovagal due to a choking episode -  CT of the head was unremarkable. - MRI negative for acute abnormalities  - continue aspirin  - no events on telemetry. - Troponins are negative., No events on telemetry.  Prolonged QT: - resolved - She's had a prolonged QT in the past but on admission it was worsened close to 600,  most likely due to electrolytes- she's not on any QT prolonging medication  Hypokalemia/hypomagnesemia: - Most likely causing a prolonged QT -Potassium low once again despite the fact that Lasix has been on hold, she is eating well and she is not having any diarrhea -We'll replace orally and via IV-have asked for nephrology assistance as well-follow-up on spot urine potassium and creatinine and 24 hour urine potassium creatinine and sodium as recommended by nephrology  Chronic kidney disease stage III: - Baseline creatinine around 1.3- presented creatinine of 1.68 - Improved with holding Lasix (on 80 mg daily) and hydration  OSA on CPAP Continue C-peptide bedtime  Diabetes mellitus - Continue NovoLog 70/30 according to home dose  Essential hypertension, benign - Lasix discontinued- BP has been stable    S/P CABG (coronary artery bypass graft) -continue aspirin    Code Status: full Family Communication: none  Disposition Plan: inpatient   Consultants:  none  Procedures:  CT headMRI  Antibiotics:  none     HPI/Subjective: The patient has no complaints of nausea, vomiting, diarrhea or abdominal pain today.  Objective: Filed Vitals:   07/01/14 2009  07/02/14 0008 07/02/14 0410 07/02/14 1011  BP: 155/66 122/37 113/46 121/51  Pulse: 58 57 65 57  Temp: 98.2 F (36.8 C) 98.6 F (37 C) 98.7 F (37.1 C) 98.4 F (36.9 C)  TempSrc: Oral Oral Oral Oral  Resp: Height:      Weight:   90.8 kg (200 lb 2.8 oz)   SpO2: 97% 99% 94% 96%    Intake/Output Summary (Last 24 hours) at 07/02/14 1311 Last data filed at 07/02/14 1246  Gross per 24 hour  Intake   1262 ml  Output   2250 ml  Net   -988 ml   Filed Weights   06/30/14 0421 07/01/14 0518 07/02/14 0410  Weight: 91 kg (200 lb 9.9 oz) 90.493 kg (199 lb 8 oz) 90.8 kg (200 lb 2.8 oz)    Exam:  General: Alert, awake, oriented x3, in no acute distress.  HEENT: No bruits, no goiter.  Heart: Regular rate and rhythm. Lungs: Good air movement,cler\ar Abdomen: Soft, nontender, nondistended, positive bowel sounds.  Neuro: Grossly intact, nonfocal.   Data Reviewed: Basic Metabolic Panel:  Recent Labs Lab 06/29/14 1712 06/29/14 2217 06/30/14 0440 06/30/14 1537 06/30/14 1920 07/01/14 1524 07/02/14 0701  NA 133* 136 134*  --  134* 135 136  K 2.2* <2.0* 2.4*  --  3.2* 3.6 2.7*  CL 84* 83* 84*  --  86* 94* 93*  CO2 37* 38* 36*  --  33* 31 33*  GLUCOSE 220* 160* 225*  --  276* 290* 198*  BUN 39* 34* 34*  --  28* 22* 16  CREATININE 1.68* 1.60* 1.61*  --  1.35* 1.41* 1.15*  CALCIUM 10.0 10.1 9.4  --  9.6 9.0 8.8*  MG 1.4* 1.4* 1.6* 2.6*  --   --  2.0   Liver Function Tests:  Recent Labs Lab 06/29/14 1712 06/29/14 2217 06/30/14 0440  AST 27 21 20   ALT 12* 11* 10*  ALKPHOS 73 62 69  BILITOT 0.5 0.8 0.7  PROT 8.4* 7.3 7.0  ALBUMIN 4.2 3.7 3.6   No results for input(s): LIPASE, AMYLASE in the last 168 hours. No results for input(s): AMMONIA in the last 168 hours. CBC:  Recent Labs Lab 06/29/14 1712 06/30/14 0440  WBC 8.7 10.4  NEUTROABS 4.6 5.9  HGB 14.2 13.2  HCT 41.5 38.1  MCV 87.0 86.6  PLT 246 202   Cardiac Enzymes:  Recent Labs Lab  06/29/14 1712 06/29/14 2217 06/30/14 0429 06/30/14 1050  TROPONINI <0.03 <0.03 <0.03 <0.03   BNP (last 3 results) No results for input(s): BNP in the last 8760 hours.  ProBNP (last 3 results)  Recent Labs  08/20/13 1031  PROBNP 1444.0*    CBG:  Recent Labs Lab 07/01/14 1135 07/01/14 1729 07/01/14 2141 07/02/14 0805 07/02/14 1158  GLUCAP 249* 282* 161* 221* 154*    No results found for this or any previous visit (from the past 240 hour(s)).   Studies: No results found.  Scheduled Meds: . ALPRAZolam  0.25 mg Oral BID  . aspirin EC  81 mg Oral Daily  . atorvastatin  20 mg Oral Daily  . heparin  5,000 Units Subcutaneous 3 times per day  . insulin aspart  3 Units Subcutaneous TID WC  . insulin aspart protamine- aspart  40 Units Subcutaneous Q supper  . insulin aspart protamine- aspart  60 Units Subcutaneous Q breakfast  . menthol-cetylpyridinium  1 lozenge Oral Q2H while awake  . pantoprazole  40 mg Oral Daily  . potassium chloride  10 mEq Intravenous Q1 Hr x 4  . sodium chloride  3 mL Intravenous Q12H   Continuous Infusions:   Time Spent: 25 min   Christy Swanson  [facility] 07/02/2014, 1:11 PM  LOS: 3 days

## 2014-07-03 LAB — BASIC METABOLIC PANEL
ANION GAP: 10 (ref 5–15)
ANION GAP: 10 (ref 5–15)
BUN: 14 mg/dL (ref 6–20)
BUN: 11 mg/dL (ref 6–20)
CHLORIDE: 97 mmol/L — AB (ref 101–111)
CO2: 33 mmol/L — ABNORMAL HIGH (ref 22–32)
CO2: 29 mmol/L (ref 22–32)
Calcium: 8.6 mg/dL — ABNORMAL LOW (ref 8.9–10.3)
Calcium: 8.6 mg/dL — ABNORMAL LOW (ref 8.9–10.3)
Chloride: 101 mmol/L (ref 101–111)
Creatinine, Ser: 1 mg/dL (ref 0.44–1.00)
Creatinine, Ser: 1.07 mg/dL — ABNORMAL HIGH (ref 0.44–1.00)
GFR calc non Af Amer: 52 mL/min — ABNORMAL LOW (ref >60)
GFR, EST AFRICAN AMERICAN: 55 mL/min — AB (ref >60)
GFR, EST NON AFRICAN AMERICAN: 48 mL/min — AB (ref >60)
Glucose, Bld: 45 mg/dL — ABNORMAL LOW (ref 65–99)
Glucose, Bld: 108 mg/dL — ABNORMAL HIGH (ref 65–99)
POTASSIUM: 4 mmol/L (ref 3.5–5.1)
Potassium: 2.8 mmol/L — ABNORMAL LOW (ref 3.5–5.1)
SODIUM: 140 mmol/L (ref 135–145)
Sodium: 140 mmol/L (ref 135–145)

## 2014-07-03 LAB — GLUCOSE, CAPILLARY
GLUCOSE-CAPILLARY: 58 mg/dL — AB (ref 65–99)
GLUCOSE-CAPILLARY: 59 mg/dL — AB (ref 65–99)
GLUCOSE-CAPILLARY: 175 mg/dL — AB (ref 65–99)
Glucose-Capillary: 72 mg/dL (ref 65–99)
Glucose-Capillary: 144 mg/dL — ABNORMAL HIGH (ref 65–99)
Glucose-Capillary: 94 mg/dL (ref 65–99)
Glucose-Capillary: 69 mg/dL (ref 65–99)

## 2014-07-03 LAB — NA AND K (SODIUM & POTASSIUM), 24 H UR
POTASSIUM UR: 11 mmol/L
Potassium, 24H Ur: 8 mmol/d — ABNORMAL LOW (ref 25–125)
SODIUM UR: 15 mmol/L
Sodium, 24H Ur: 11 mEq/d — ABNORMAL LOW (ref 40–220)
Urine Total Volume-UNAK24: 700 mL

## 2014-07-03 LAB — CREATININE, URINE, 24 HOUR
CREATININE 24H UR: 582 mg/d — AB (ref 600–1800)
Collection Interval-UCRE24: 24 hours
Creatinine, Urine: 83.11 mg/dL
URINE TOTAL VOLUME-UCRE24: 700 mL

## 2014-07-03 MED ORDER — INSULIN ASPART PROT & ASPART (70-30 MIX) 100 UNIT/ML ~~LOC~~ SUSP
32.0000 [IU] | Freq: Every day | SUBCUTANEOUS | Status: DC
  Filled 2014-07-03: qty 10

## 2014-07-03 MED ORDER — SODIUM CHLORIDE 0.9 % IV SOLN
INTRAVENOUS | Status: DC
  Administered 2014-07-03 – 2014-07-04 (×2): via INTRAVENOUS

## 2014-07-03 MED ORDER — INSULIN ASPART PROT & ASPART (70-30 MIX) 100 UNIT/ML ~~LOC~~ SUSP
54.0000 [IU] | Freq: Every day | SUBCUTANEOUS | Status: DC
  Administered 2014-07-04: 54 [IU] via SUBCUTANEOUS
  Filled 2014-07-03: qty 10

## 2014-07-03 MED ORDER — POTASSIUM CHLORIDE CRYS ER 20 MEQ PO TBCR
40.0000 meq | EXTENDED_RELEASE_TABLET | ORAL | Status: AC
  Administered 2014-07-03 (×3): 40 meq via ORAL
  Filled 2014-07-03 (×2): qty 2

## 2014-07-03 MED ORDER — SPIRONOLACTONE 25 MG PO TABS
25.0000 mg | ORAL_TABLET | Freq: Every day | ORAL | Status: DC
  Administered 2014-07-03 – 2014-07-04 (×2): 25 mg via ORAL
  Filled 2014-07-03 (×2): qty 1

## 2014-07-03 NOTE — Progress Notes (Signed)
[facility] PROGRESS NOTE     Hospital course: This is a 81 year old female with a history of hypertension, coronary artery disease, CKD 3, obstructive sleep apnea who is blind. She was admitted after passing out while choking on food and was found to have a severely low potassium level which was < 2.0 and low Mg+ of 1.4. Her Lasix was held.   Assessment/Plan: Syncope - Most likely vasovagal due to a choking episode -  CT of the head was unremarkable. - MRI negative for acute abnormalities  - continue aspirin  - no events on telemetry. - Troponins are negative., No events on telemetry.  Prolonged QT: - resolved - She's had a prolonged QT in the past but on admission it was worsened close to 600,  most likely due to electrolytes- she's not on any QT prolonging medication  Hypokalemia/hypomagnesemia: -Potassium continues to be low despite aggressive replacement - Renal has started spironolactone at 25 mg daily  Chronic kidney disease stage III: - Baseline creatinine around 1.3- presented creatinine of 1.68 - Improved with holding Lasix (on 80 mg daily) and hydration  OSA on CPAP Continue C-peptide bedtime  Diabetes mellitus - Continue NovoLog 70/30 according to home dose  Essential hypertension, benign - Lasix discontinued- BP has been stable    S/P CABG (coronary artery bypass graft) -continue aspirin    Code Status: full Family Communication: none  Disposition Plan: inpatient   Consultants:  none  Procedures:  CT headMRI  Antibiotics:  none     HPI/Subjective: No complaints - eating and drinking well.   Objective: Filed Vitals:   07/03/14 0413 07/03/14 0500 07/03/14 0803 07/03/14 1230  BP: 128/53  140/53 141/62  Pulse: 54  54 52  Temp: 97.7 F (36.5 C)  97.5 F (36.4 C) 98.4 F (36.9 C)  TempSrc: Oral  Oral Oral  Resp: 18  16 16   Height:      Weight:  90.4 kg (199 lb 4.7 oz)    SpO2: 100%  99% 100%    Intake/Output Summary (Last  24 hours) at 07/03/14 1534 Last data filed at 07/03/14 1342  Gross per 24 hour  Intake 3563.67 ml  Output   1000 ml  Net 2563.67 ml   Filed Weights   07/01/14 0518 07/02/14 0410 07/03/14 0500  Weight: 90.493 kg (199 lb 8 oz) 90.8 kg (200 lb 2.8 oz) 90.4 kg (199 lb 4.7 oz)    Exam:  General: Alert, awake, oriented x3, in no acute distress.  HEENT: No bruits, no goiter.  Heart: Regular rate and rhythm. Lungs: Good air movement,cler\ar Abdomen: Soft, nontender, nondistended, positive bowel sounds.  Neuro: Grossly intact, nonfocal.   Data Reviewed: Basic Metabolic Panel:  Recent Labs Lab 06/29/14 1712 06/29/14 2217 06/30/14 0440 06/30/14 1537 06/30/14 1920 07/01/14 1524 07/02/14 0701 07/02/14 1540 07/03/14 0525  NA 133* 136 134*  --  134* 135 136 137 140  K 2.2* <2.0* 2.4*  --  3.2* 3.6 2.7* 3.3* 2.8*  CL 84* 83* 84*  --  86* 94* 93* 94* 97*  CO2 37* 38* 36*  --  33* 31 33* 34* 33*  GLUCOSE 220* 160* 225*  --  276* 290* 198* 180* 45*  BUN 39* 34* 34*  --  28* 22* 16 16 14   CREATININE 1.68* 1.60* 1.61*  --  1.35* 1.41* 1.15* 1.14* 1.00  CALCIUM 10.0 10.1 9.4  --  9.6 9.0 8.8* 9.3 8.6*  MG 1.4* 1.4* 1.6* 2.6*  --   --  2.0  --   --    Liver Function Tests:  Recent Labs Lab 06/29/14 1712 06/29/14 2217 06/30/14 0440  AST ALT 12* 11* 10*  ALKPHOS 73 62 69  BILITOT 0.5 0.8 0.7  PROT 8.4* 7.3 7.0  ALBUMIN 4.2 3.7 3.6   No results for input(s): LIPASE, AMYLASE in the last 168 hours. No results for input(s): AMMONIA in the last 168 hours. CBC:  Recent Labs Lab 06/29/14 1712 06/30/14 0440  WBC 8.7 10.4  NEUTROABS 4.6 5.9  HGB 14.2 13.2  HCT 41.5 38.1  MCV 87.0 86.6  PLT 246 202   Cardiac Enzymes:  Recent Labs Lab 06/29/14 1712 06/29/14 2217 06/30/14 0429 06/30/14 1050  TROPONINI <0.03 <0.03 <0.03 <0.03   BNP (last 3 results) No results for input(s): BNP in the last 8760 hours.  ProBNP (last 3 results)  Recent Labs  08/20/13 1031    PROBNP 1444.0*    CBG:  Recent Labs Lab 07/03/14 0759 07/03/14 0832 07/03/14 0900 07/03/14 1024 07/03/14 1229  GLUCAP 58* 59* 72 144* 175*    No results found for this or any previous visit (from the past 240 hour(s)).   Studies: No results found.  Scheduled Meds: . ALPRAZolam  0.25 mg Oral BID  . aspirin EC  81 mg Oral Daily  . atorvastatin  20 mg Oral Daily  . heparin  5,000 Units Subcutaneous 3 times per day  . insulin aspart  3 Units Subcutaneous TID WC  . insulin aspart protamine- aspart  40 Units Subcutaneous Q supper  . insulin aspart protamine- aspart  60 Units Subcutaneous Q breakfast  . menthol-cetylpyridinium  1 lozenge Oral Q2H while awake  . pantoprazole  40 mg Oral Daily  . potassium chloride  40 mEq Oral Q4H  . sodium chloride  3 mL Intravenous Q12H  . spironolactone  25 mg Oral Daily   Continuous Infusions: . sodium chloride 100 mL/hr at 07/03/14 0812    Time Spent: 25 min   Christy Swanson  [facility] 07/03/2014, 3:34 PM  LOS: 4 days

## 2014-07-03 NOTE — Progress Notes (Signed)
Mindy from [facility] in [place], [place] (patient's home health company) called to inquire about patient admission date.

## 2014-07-03 NOTE — Progress Notes (Signed)
Admit: 06/29/2014 LOS: 4  59F admitted with syncope related to cough anf found to have severe hypokalemia, metabolic alkalosis. Hypokalemia and alkalosis peristent after holding loop diuretic and K repletion.  Eating well w/o clear Gi losses  Subjective:  No new events K down again this AM   06/15 0701 - 06/16 0700 In: 1620 [P.O.:720; I.V.:500; IV Piggyback:400] Out: 1200 [Urine:1200]  Filed Weights   07/01/14 0518 07/02/14 0410 07/03/14 0500  Weight: 90.493 kg (199 lb 8 oz) 90.8 kg (200 lb 2.8 oz) 90.4 kg (199 lb 4.7 oz)    Scheduled Meds: . ALPRAZolam  0.25 mg Oral BID  . aspirin EC  81 mg Oral Daily  . atorvastatin  20 mg Oral Daily  . heparin  5,000 Units Subcutaneous 3 times per day  . insulin aspart  3 Units Subcutaneous TID WC  . insulin aspart protamine- aspart  40 Units Subcutaneous Q supper  . insulin aspart protamine- aspart  60 Units Subcutaneous Q breakfast  . menthol-cetylpyridinium  1 lozenge Oral Q2H while awake  . pantoprazole  40 mg Oral Daily  . potassium chloride  40 mEq Oral Q4H  . sodium chloride  3 mL Intravenous Q12H  . spironolactone  25 mg Oral Daily   Continuous Infusions: . sodium chloride 100 mL/hr at 07/03/14 0812   PRN Meds:.acetaminophen **OR** acetaminophen, alum & mag hydroxide-simeth, potassium chloride  Current Labs: reviewed  24h U for K and Cr pending U spot K and Cr pending PRA and Aldo pending   Physical Exam:  Blood pressure 140/53, pulse 54, temperature 97.5 F (36.4 C), temperature source Oral, resp. rate 16, height 5\' 2"  (1.575 m), weight 90.4 kg (199 lb 4.7 oz), SpO2 99 %. GEN: NAD, obese, frail-appearing ENCAT  EYES: blind in both eyes CV: RRR PULM: CTAB ABD: s/nt/nd SKIN: no rashes/lesions EXT:no LEE  A/P 1. Hypokalemia and Metabolic Alkalosis 1. Current hypokalemia and alkalosis clearly separate from any loop diuretic effect 2. Good PO intake and no clear GI losses 3. Suspect renal K wasting, possible  hyperaldosteronism 4. Cont 40 mEq PO today  5. Check plasma renin and aldo levels 6. Await 24h and spot Urine K and Cr 7. Add spironolactone 25mg  daily 8. BID BMP 2. Mild CKD 3. CAD 4. Syncope, likely vasovagal 5. Hypomagnesemia  Sabra Heck MD 07/03/2014, 10:58 AM   Recent Labs Lab 07/02/14 0701 07/02/14 1540 07/03/14 0525  NA 136 137 140  K 2.7* 3.3* 2.8*  CL 93* 94* 97*  CO2 33* 34* 33*  GLUCOSE 198* 180* 45*  BUN 16 16 14   CREATININE 1.15* 1.14* 1.00  CALCIUM 8.8* 9.3 8.6*    Recent Labs Lab 06/29/14 1712 06/30/14 0440  WBC 8.7 10.4  NEUTROABS 4.6 5.9  HGB 14.2 13.2  HCT 41.5 38.1  MCV 87.0 86.6  PLT 246 202

## 2014-07-03 NOTE — Progress Notes (Signed)
Spoke with Patient's daughter she chooses to continue with [facility]. Contacted [facility] [phone], she agreed they would happy to take her back and have been providing care fo rher for years. FAX DC note, Last PT note, Facesheet, PT order to [place] at [phone] at discharge.

## 2014-07-03 NOTE — Progress Notes (Signed)
Placed patient on CPAP for the night.  Patient is tolerating well at this time. 

## 2014-07-03 NOTE — Progress Notes (Signed)
Hypoglycemic Event  CBG: 58  Treatment: 15 GM carbohydrate snack  Symptoms: None  Follow-up CBG: Time: 0832 CBG Result: 59  Follow-up CBG: Time: 0900 CBG Result: 72  Possible Reasons for Event: Inadequate meal intake, patient had 4 cups of juice and ate 75% of her breakfast, patient on MIVF of NS@ 100/hr.   Comments/MD notified: MD Rizwan notified and aware    Leane Platt

## 2014-07-03 NOTE — Progress Notes (Signed)
Utilization Review completed. Merlinda Wrubel RN BSN CM 

## 2014-07-03 NOTE — Progress Notes (Signed)
Results for HECTOR, ALLAIN (MRN [id number]) as of 07/03/2014 12:16  Ref. Range 07/02/2014 21:27 07/03/2014 07:59 07/03/2014 08:32 07/03/2014 09:00 07/03/2014 10:24  Glucose-Capillary Latest Ref Range: 65-99 mg/dL 022 (H) 58 (L) 59 (L) 72 144 (H)  Fasting CBG at 0525 noted to be 45 mg/dl and then treated for hypoglycemia which raised the blood sugar to 58-59-72 mg/dl.   Recommend decreasing 70/30 evening dose of insulin to 35 units at supper and discontinuing the Novolog 3 units TID meal coverage. If CBGs continue to be low, recommend decreasing both doses of 70/30 insulin. Patient eating about 75% of meals. Will continue to follow while in hospital. Smith Mince RN BSN CDE

## 2014-07-03 NOTE — Progress Notes (Signed)
MD Craige Cotta paged, patient UOP since 10am this morning, asked if he wanted to restart MIVF.

## 2014-07-04 DIAGNOSIS — G4733 Obstructive sleep apnea (adult) (pediatric): Secondary | ICD-10-CM

## 2014-07-04 LAB — BASIC METABOLIC PANEL
ANION GAP: 7 (ref 5–15)
BUN: 10 mg/dL (ref 6–20)
CHLORIDE: 105 mmol/L (ref 101–111)
CO2: 28 mmol/L (ref 22–32)
Calcium: 8.4 mg/dL — ABNORMAL LOW (ref 8.9–10.3)
Creatinine, Ser: 1.01 mg/dL — ABNORMAL HIGH (ref 0.44–1.00)
GFR calc Af Amer: 59 mL/min — ABNORMAL LOW (ref >60)
GFR calc non Af Amer: 51 mL/min — ABNORMAL LOW (ref >60)
Glucose, Bld: 70 mg/dL (ref 65–99)
POTASSIUM: 4.5 mmol/L (ref 3.5–5.1)
Sodium: 140 mmol/L (ref 135–145)

## 2014-07-04 LAB — GLUCOSE, CAPILLARY
Glucose-Capillary: 117 mg/dL — ABNORMAL HIGH (ref 65–99)
Glucose-Capillary: 143 mg/dL — ABNORMAL HIGH (ref 65–99)

## 2014-07-04 MED ORDER — INSULIN NPH ISOPHANE & REGULAR (70-30) 100 UNIT/ML ~~LOC~~ SUSP: 40.0000 [IU] | Freq: Two times a day (BID) | SUBCUTANEOUS | Status: DC

## 2014-07-04 MED ORDER — SPIRONOLACTONE 25 MG PO TABS: 25.0000 mg | ORAL_TABLET | Freq: Every day | ORAL | Status: AC

## 2014-07-04 MED ORDER — ISOSORBIDE MONONITRATE ER 30 MG PO TB24: 30.0000 mg | ORAL_TABLET | Freq: Every day | ORAL | Status: AC

## 2014-07-04 NOTE — Discharge Summary (Signed)
Physician Discharge Summary  Christy Swanson QIO:[id number] DOB: 1931/11/27 DOA: 06/29/2014  PCP: Eric Form, MD  Admit date: 06/29/2014 Discharge date: 07/04/2014  Time spent:60 minutes  Recommendations for Outpatient Follow-up:  1. Do not resume Lasix 2. HHRN to check K+ in one 1 wk 3. HHPT/OT ordered  Discharge Condition: stable Diet recommendation: heart healthy, low sodium, diabetic diet  Discharge Diagnoses:  Principal Problem:   Syncope Active Problems:   CAD (coronary artery disease)   S/P CABG (coronary artery bypass graft)   Diabetes mellitus   Blindness   Essential hypertension, benign   Hypokalemia   Prolonged Q-T interval on ECG   Dysphagia   OSA on CPAP   History of present illness:  This is a 81 year old female with a history of hypertension, IDDM, coronary artery disease, CKD 3, obstructive sleep apnea who is blind. She is in [place] visiting her daughter.  She was admitted after passing out while choking on food and was found to have a severely low potassium level which was < 2.0 and low Mg+ of 1.4. Her Lasix was held.   Hospital Course:  Syncope - suspected to be vasovagal due to a choking episode - CT of the head was unremarkable. - MRI negative for acute abnormalities  - no events on telemetry. - Troponins negative  Prolonged QT: - resolved - Qt 590 on admission- most likely due to electrolytes- she's not on any QT prolonging medication - Qtc now 474  Hypokalemia/hypomagnesemia: -Potassium continued to be low despite aggressive replacement and holding Lasix on admission - Renal started spironolactone on 6/16 at 25 mg daily with improvement in K- to 4.0 on the evening it was started (yesterday) and 4.5 today - Renal recommending to continue Aldactone at 25 mg dialy - will have home health check another K+ in 1 wk - she lives in [place] and is here visiting her daughter- patient not sure if she will stay in this area but can follow with Dr  Marisue Humble if she does  Chronic kidney disease stage III: - Baseline creatinine around 1.3- presented creatinine of 1.68 - Improved with holding Lasix (on 80 mg daily) and hydration  OSA on CPAP Continue C-peptide bedtime  Diabetes mellitus - Continue NovoLog 70/30 according to home dose  Essential hypertension, benign - Lasix discontinued- BP has been stable   S/P CABG (coronary artery bypass graft) -continue aspirin  Consultations:  Nephrology  Discharge Exam: Filed Weights   07/02/14 0410 07/03/14 0500 07/04/14 0500  Weight: 90.8 kg (200 lb 2.8 oz) 90.4 kg (199 lb 4.7 oz) 92.8 kg (204 lb 9.4 oz)   Filed Vitals:   07/04/14 0553  BP: 123/54  Pulse: 59  Temp: 98.2 F (36.8 C)  Resp: 18    General: AAO x 3, no distress Cardiovascular: RRR, no murmurs  Respiratory: clear to auscultation bilaterally GI: soft, non-tender, non-distended, bowel sound positive  Discharge Instructions You were cared for by a hospitalist during your hospital stay. If you have any questions about your discharge medications or the care you received while you were in the hospital after you are discharged, you can call the unit and asked to speak with the hospitalist on call if the hospitalist that took care of you is not available. Once you are discharged, your primary care physician will handle any further medical issues. Please note that NO REFILLS for any discharge medications will be authorized once you are discharged, as it is imperative that you return to your primary care physician (  or establish a relationship with a primary care physician if you do not have one) for your aftercare needs so that they can reassess your need for medications and monitor your lab values.      Discharge Instructions    Discharge instructions    Complete by:  As directed   Low sodium, heart healthy, diabetic diet DO NOT RESTART LASIX See family doctor in 1-2 weeks. Take records from our hospital to the family  doctor.     Increase activity slowly    Complete by:  As directed             Medication List    STOP taking these medications        amLODipine 5 MG tablet  Commonly known as:  NORVASC     esomeprazole 40 MG capsule  Commonly known as:  NEXIUM     furosemide 80 MG tablet  Commonly known as:  LASIX     potassium chloride SA 20 MEQ tablet  Commonly known as:  K-DUR,KLOR-CON      TAKE these medications        ALPRAZolam 0.25 MG tablet  Commonly known as:  XANAX  Take 0.25 mg by mouth every other day.     aspirin EC 81 MG tablet  Take 1 tablet (81 mg total) by mouth daily.     atorvastatin 20 MG tablet  Commonly known as:  LIPITOR  Take 20 mg by mouth daily.     carvedilol 3.125 MG tablet  Commonly known as:  COREG  Take 3.125 mg by mouth 2 (two) times daily with a meal.     citalopram 20 MG tablet  Commonly known as:  CELEXA  Take 1 tablet (20 mg total) by mouth daily.     colchicine 0.6 MG tablet  Take 0.6 mg by mouth 2 (two) times daily as needed (for gout).     donepezil 10 MG tablet  Commonly known as:  ARICEPT  Take 10 mg by mouth at bedtime.     gabapentin 300 MG capsule  Commonly known as:  NEURONTIN  Take 300 mg by mouth 2 (two) times daily.     insulin NPH-regular Human (70-30) 100 UNIT/ML injection  Commonly known as:  NOVOLIN 70/30  Inject 40-50 Units into the skin 2 (two) times daily with a meal. 50 units q am, 40 units qpm     isosorbide mononitrate 30 MG 24 hr tablet  Commonly known as:  IMDUR  Take 1 tablet (30 mg total) by mouth daily.     nitroGLYCERIN 0.4 MG SL tablet  Commonly known as:  NITROSTAT  Place 1 tablet (0.4 mg total) under the tongue every 5 (five) minutes as needed for chest pain (upto 3 doses.).     spironolactone 25 MG tablet  Commonly known as:  ALDACTONE  Take 1 tablet (25 mg total) by mouth daily.     sucralfate 1 G tablet  Commonly known as:  CARAFATE  Take 1 g by mouth 3 (three) times daily.       No  Known Allergies Follow-up Information    Follow up with [facility].   Why:  All information Faxed to [facility] on 6/17/ 16 for services to resume.   Contact information:   Home care company patient is active with in [facility].       Follow up with [place], Fulton Reek, MD In 1 week.   Specialty:  Nephrology   Why:  if your decide to stay in [place], you can follow up to ensure your postassium is staying stable   Contact information:   [address] [place] [address] [phone]        The results of significant diagnostics from this hospitalization (including imaging, microbiology, ancillary and laboratory) are listed below for reference.    Significant Diagnostic Studies: Dg Chest 2 View  06/29/2014   CLINICAL DATA:  Acute onset of dizziness. Choked on water. Initial encounter.  EXAM: CHEST  2 VIEW  COMPARISON:  Chest radiograph performed 08/20/2013  FINDINGS: The lungs are well-aerated. Mild bilateral atelectasis or scarring is noted. The lungs are otherwise clear. There is no evidence of pleural effusion or pneumothorax.  The heart is borderline normal in size. The patient is status post median sternotomy. No acute osseous abnormalities are seen.  IMPRESSION: Mild bilateral atelectasis or scarring noted. Lungs otherwise clear.   Electronically Signed   By: Roanna Raider M.D.   On: 06/29/2014 17:59   Ct Head Wo Contrast  06/29/2014   CLINICAL DATA:  Cough, syncope, denies headache and dizziness  EXAM: CT HEAD WITHOUT CONTRAST  TECHNIQUE: Contiguous axial images were obtained from the base of the skull through the vertex without intravenous contrast.  COMPARISON:  08/18/2013  FINDINGS: There is no evidence of mass effect, midline shift, or extra-axial fluid collections. There is no evidence of a space-occupying lesion or intracranial hemorrhage. There is no evidence of a cortical-based area of acute infarction. There is generalized cerebral atrophy. There is periventricular  white matter low attenuation likely secondary to microangiopathy.  The ventricles and sulci are appropriate for the patient's age. The basal cisterns are patent.  Incidental note is made of left globe prosthesis. The right orbit is normal. The visualized portions of the paranasal sinuses and mastoid air cells are unremarkable. Cerebrovascular atherosclerotic calcifications are noted.  The osseous structures are unremarkable.  IMPRESSION: 1. No acute intracranial pathology. 2. Chronic microvascular disease and cerebral atrophy.   Electronically Signed   By: Elige Ko   On: 06/29/2014 17:57   Mri Brain Without Contrast  06/30/2014   CLINICAL DATA:  Syncope. History of diabetes, hypertension, heart failure, kidney disease.  EXAM: MRI HEAD WITHOUT CONTRAST  TECHNIQUE: Multiplanar, multiecho pulse sequences of the brain and surrounding structures were obtained without intravenous contrast.  COMPARISON:  CT head June 29, 2014  FINDINGS: The ventricles and sulci are normal for patient's age. No suspicious parenchymal signal, mass lesions, mass effect. Patchy supratentorial white matter FLAIR T2 hyperintensities, less than expected for age. No reduced diffusion to suggest acute ischemia. No susceptibility artifact to suggest hemorrhage. Old small RIGHT cerebellar infarcts.  No abnormal extra-axial fluid collections. No extra-axial masses though, contrast enhanced sequences would be more sensitive. Normal major intracranial vascular flow voids seen at the skull base.  LEFT globe prosthesis and atrophic LEFT optic nerve. Status post RIGHT ocular lens implant. No abnormal sellar expansion. Visualized paranasal sinuses and mastoid air cells are well-aerated. No suspicious calvarial bone marrow signal. No abnormal sellar expansion. Craniocervical junction maintained. Patient is edentulous.  IMPRESSION: No acute intracranial process.  Involutional changes. Mild white matter changes compatible chronic small vessel ischemic  disease. 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   Electronically Signed   By: Awilda Metro M.D.   On: 06/30/2014 04:23    Microbiology: No results found for this or any previous visit (from the past 240 hour(s)).   Labs: Basic Metabolic Panel:  Recent Labs Lab 06/29/14  1712 06/29/14 2217 06/30/14 0440 06/30/14 1537  07/02/14 0701 07/02/14 1540 07/03/14 0525 07/03/14 1644 07/04/14 0404  NA 133* 136 134*  --   < > 136 137 140 140 140  K 2.2* <2.0* 2.4*  --   < > 2.7* 3.3* 2.8* 4.0 4.5  CL 84* 83* 84*  --   < > 93* 94* 97* 101 105  CO2 37* 38* 36*  --   < > 33* 34* 33* 29 28  GLUCOSE 220* 160* 225*  --   < > 198* 180* 45* 108* 70  BUN 39* 34* 34*  --   < > CREATININE 1.68* 1.60* 1.61*  --   < > 1.15* 1.14* 1.00 1.07* 1.01*  CALCIUM 10.0 10.1 9.4  --   < > 8.8* 9.3 8.6* 8.6* 8.4*  MG 1.4* 1.4* 1.6* 2.6*  --  2.0  --   --   --   --   < > = values in this interval not displayed. Liver Function Tests:  Recent Labs Lab 06/29/14 1712 06/29/14 2217 06/30/14 0440  AST ALT 12* 11* 10*  ALKPHOS 73 62 69  BILITOT 0.5 0.8 0.7  PROT 8.4* 7.3 7.0  ALBUMIN 4.2 3.7 3.6   No results for input(s): LIPASE, AMYLASE in the last 168 hours. No results for input(s): AMMONIA in the last 168 hours. CBC:  Recent Labs Lab 06/29/14 1712 06/30/14 0440  WBC 8.7 10.4  NEUTROABS 4.6 5.9  HGB 14.2 13.2  HCT 41.5 38.1  MCV 87.0 86.6  PLT 246 202   Cardiac Enzymes:  Recent Labs Lab 06/29/14 1712 06/29/14 2217 06/30/14 0429 06/30/14 1050  TROPONINI <0.03 <0.03 <0.03 <0.03   BNP: BNP (last 3 results) No results for input(s): BNP in the last 8760 hours.  ProBNP (last 3 results)  Recent Labs  08/20/13 1031  PROBNP 1444.0*    CBG:  Recent Labs Lab 07/03/14 1229 07/03/14 1700 07/03/14 2226 07/04/14 0824 07/04/14 1216  GLUCAP 175* 94 69 117* 143*       SignedCalvert Cantor, MD [facility] 07/04/2014, 2:12 PM

## 2014-07-04 NOTE — Discharge Instructions (Signed)

## 2014-07-04 NOTE — Progress Notes (Signed)
Admit: 06/29/2014 LOS: 5  86F admitted with syncope related to cough anf found to have severe hypokalemia, metabolic alkalosis. Hypokalemia and alkalosis peristent after holding loop diuretic and K repletion.  Eating well w/o clear Gi losses  Subjective:  No new events Potassium increased to 4.5 this morning 24-hour urine reviewed, creatinine generation is on the low side, perhaps incomplete collection versus low muscle mass and elderly female Urine potassium and sodium quite low, not consistent with renal potassium wasting; however no clear other evidence of low intake or gastrointestinal loss  06/16 0701 - 06/17 0700 In: 3223.7 [P.O.:1662; I.V.:1561.7] Out: 950 [Urine:950]  Filed Weights   07/02/14 0410 07/03/14 0500 07/04/14 0500  Weight: 90.8 kg (200 lb 2.8 oz) 90.4 kg (199 lb 4.7 oz) 92.8 kg (204 lb 9.4 oz)    Scheduled Meds: . ALPRAZolam  0.25 mg Oral BID  . aspirin EC  81 mg Oral Daily  . atorvastatin  20 mg Oral Daily  . heparin  5,000 Units Subcutaneous 3 times per day  . insulin aspart protamine- aspart  32 Units Subcutaneous Q supper  . insulin aspart protamine- aspart  54 Units Subcutaneous Q breakfast  . menthol-cetylpyridinium  1 lozenge Oral Q2H while awake  . pantoprazole  40 mg Oral Daily  . sodium chloride  3 mL Intravenous Q12H  . spironolactone  25 mg Oral Daily   Continuous Infusions: . sodium chloride 75 mL/hr at 07/04/14 0316   PRN Meds:.acetaminophen **OR** acetaminophen, alum & mag hydroxide-simeth, potassium chloride  Current Labs: reviewed  24h U -- low Na and K excretion -- 8 mmol K excretion and 11 mmol Na excretion PRA and Aldo pending   Physical Exam:  Blood pressure 123/54, pulse 59, temperature 98.2 F (36.8 C), temperature source Oral, resp. rate 18, height 5\' 2"  (1.575 m), weight 92.8 kg (204 lb 9.4 oz), SpO2 100 %. GEN: NAD, obese, frail-appearing ENCAT  EYES: blind in both eyes CV: RRR PULM: CTAB ABD: s/nt/nd SKIN: no  rashes/lesions EXT:no LEE  A/P 1. Hypokalemia and Metabolic Alkalosis 1. Current hypokalemia and alkalosis clearly separate from any loop diuretic effect 2. Good PO intake and no clear GI losses 3. Suspect renal K wasting, possible hyperaldosteronism 1. 24-hour urine studies are not consistent with this 2. I don't have another clear explanation other than perhaps poor intake; however, we were providing potassium supplementation 4. Given improvement in serum potassium I would continue the current course reduce potassium replacement 20 mEq daily  5. Plasma renin and aldosteronoma levels pending 6. Continue spironolactone 25mg  daily 7. Patient [place] in [place]. If she will be living closer to [facility] I would be happy to see her in the office to follow-up. Otherwise she will need to connect with her primary care doctor and possibly nephrologist locally. 2. Mild CKD 3. CAD 4. Syncope, likely vasovagal 5. Hypomagnesemia  Sabra Heck MD 07/04/2014, 11:21 AM   Recent Labs Lab 07/03/14 0525 07/03/14 1644 07/04/14 0404  NA 140 140 140  K 2.8* 4.0 4.5  CL 97* 101 105  CO2 33* 29 28  GLUCOSE 45* 108* 70  BUN 14 11 10   CREATININE 1.00 1.07* 1.01*  CALCIUM 8.6* 8.6* 8.4*    Recent Labs Lab 06/29/14 1712 06/30/14 0440  WBC 8.7 10.4  NEUTROABS 4.6 5.9  HGB 14.2 13.2  HCT 41.5 38.1  MCV 87.0 86.6  PLT 246 202

## 2014-07-04 NOTE — Progress Notes (Signed)
Reviewed discharge paperwork with pt and daughter.  Pt denied any needs at this time.  PIV's removed.  Pt taken to discharge location via wheelchair.

## 2014-07-04 NOTE — Progress Notes (Addendum)
All information Faxed out to [place] at [facility] for home care to resume. Anticipate DC over the weekend. IF DC'D OVER WEEKEND CALL [phone] TO NOTIFY INTAKE RN.

## 2014-07-04 NOTE — Progress Notes (Signed)
Physical Therapy Treatment Patient Details Name: Christy Swanson MRN: [id number] DOB: Nov 29, 1931 Today's Date: 07/08/2014    History of Present Illness Pt adm with syncope after choking episode. PMH - DM, HTN, CABG, blind    PT Comments    Pt with steady progress.  Follow Up Recommendations  Home health PT;Supervision for mobility/OOB     Equipment Recommendations  None recommended by PT    Recommendations for Other Services       Precautions / Restrictions Precautions Precautions: Fall Precaution Comments: blind Restrictions Weight Bearing Restrictions: No    Mobility  Bed Mobility Overal bed mobility: Modified Independent                Transfers Overall transfer level: Needs assistance Equipment used: Straight cane Transfers: Sit to/from Stand Sit to Stand: Min guard         General transfer comment: Assist for balance and guidance  Ambulation/Gait Ambulation/Gait assistance: Min assist Ambulation Distance (Feet): 150 Feet Assistive device: Straight cane;1 person hand held assist Gait Pattern/deviations: Step-through pattern;Decreased stride length   Gait velocity interpretation: Below normal speed for age/gender General Gait Details: Assist for balance and guidance due to blindness   Stairs            Wheelchair Mobility    Modified Rankin (Stroke Patients Only)       Balance Overall balance assessment: Needs assistance Sitting-balance support: No upper extremity supported Sitting balance-Leahy Scale: Good     Standing balance support: Single extremity supported Standing balance-Leahy Scale: Poor                      Cognition Arousal/Alertness: Awake/alert Behavior During Therapy: WFL for tasks assessed/performed Overall Cognitive Status: Within Functional Limits for tasks assessed                      Exercises      General Comments        Pertinent Vitals/Pain      Home Living                       Prior Function            PT Goals (current goals can now be found in the care plan section) Acute Rehab PT Goals Patient Stated Goal: return home Progress towards PT goals: Progressing toward goals    Frequency  Min 3X/week    PT Plan Current plan remains appropriate    Co-evaluation             End of Session Equipment Utilized During Treatment: Gait belt Activity Tolerance: Patient tolerated treatment well Patient left: in chair;with call bell/phone within reach     Time: 0926-0945 PT Time Calculation (min) (ACUTE ONLY): 19 min  Charges:  $Gait Training: 8-22 mins                    G Codes:      Christy Swanson 07-08-2014, 10:41 AM  Christy Swanson PT [phone]

## 2014-07-05 MED ORDER — INSULIN NPH ISOPHANE & REGULAR (70-30) 100 UNIT/ML ~~LOC~~ SUSP: 40.0000 [IU] | Freq: Two times a day (BID) | SUBCUTANEOUS | Status: DC

## 2014-07-06 LAB — ALDOSTERONE + RENIN ACTIVITY W/ RATIO
ALDO / PRA Ratio: 0.5 (ref 0.0–30.0)
ALDOSTERONE: 2.7 ng/dL (ref 0.0–30.0)
PRA LC/MS/MS: 4.97 ng/mL/hr

## 2014-07-06 MED ORDER — INSULIN ISOPHANE & REGULAR (HUMAN 70-30)100 UNIT/ML KWIKPEN: PEN_INJECTOR | SUBCUTANEOUS | Status: DC

## 2014-07-06 MED ORDER — INSULIN ISOPHANE & REGULAR (HUMAN 70-30)100 UNIT/ML KWIKPEN: PEN_INJECTOR | SUBCUTANEOUS | Status: AC

## 2016-10-20 ENCOUNTER — Other Ambulatory Visit (HOSPITAL_COMMUNITY): Payer: Self-pay

## 2016-10-20 NOTE — Progress Notes (Signed)
Created in error

## 2016-12-09 IMAGING — DX DG CHEST 2V
2 series · 2 of 2 positions shown · non-contrast
Comparison: Chest radiograph performed 08/20/2013

CLINICAL DATA: Acute onset of dizziness. Choked on water. Initial
encounter.

EXAM:
CHEST  2 VIEW

[chest lat]
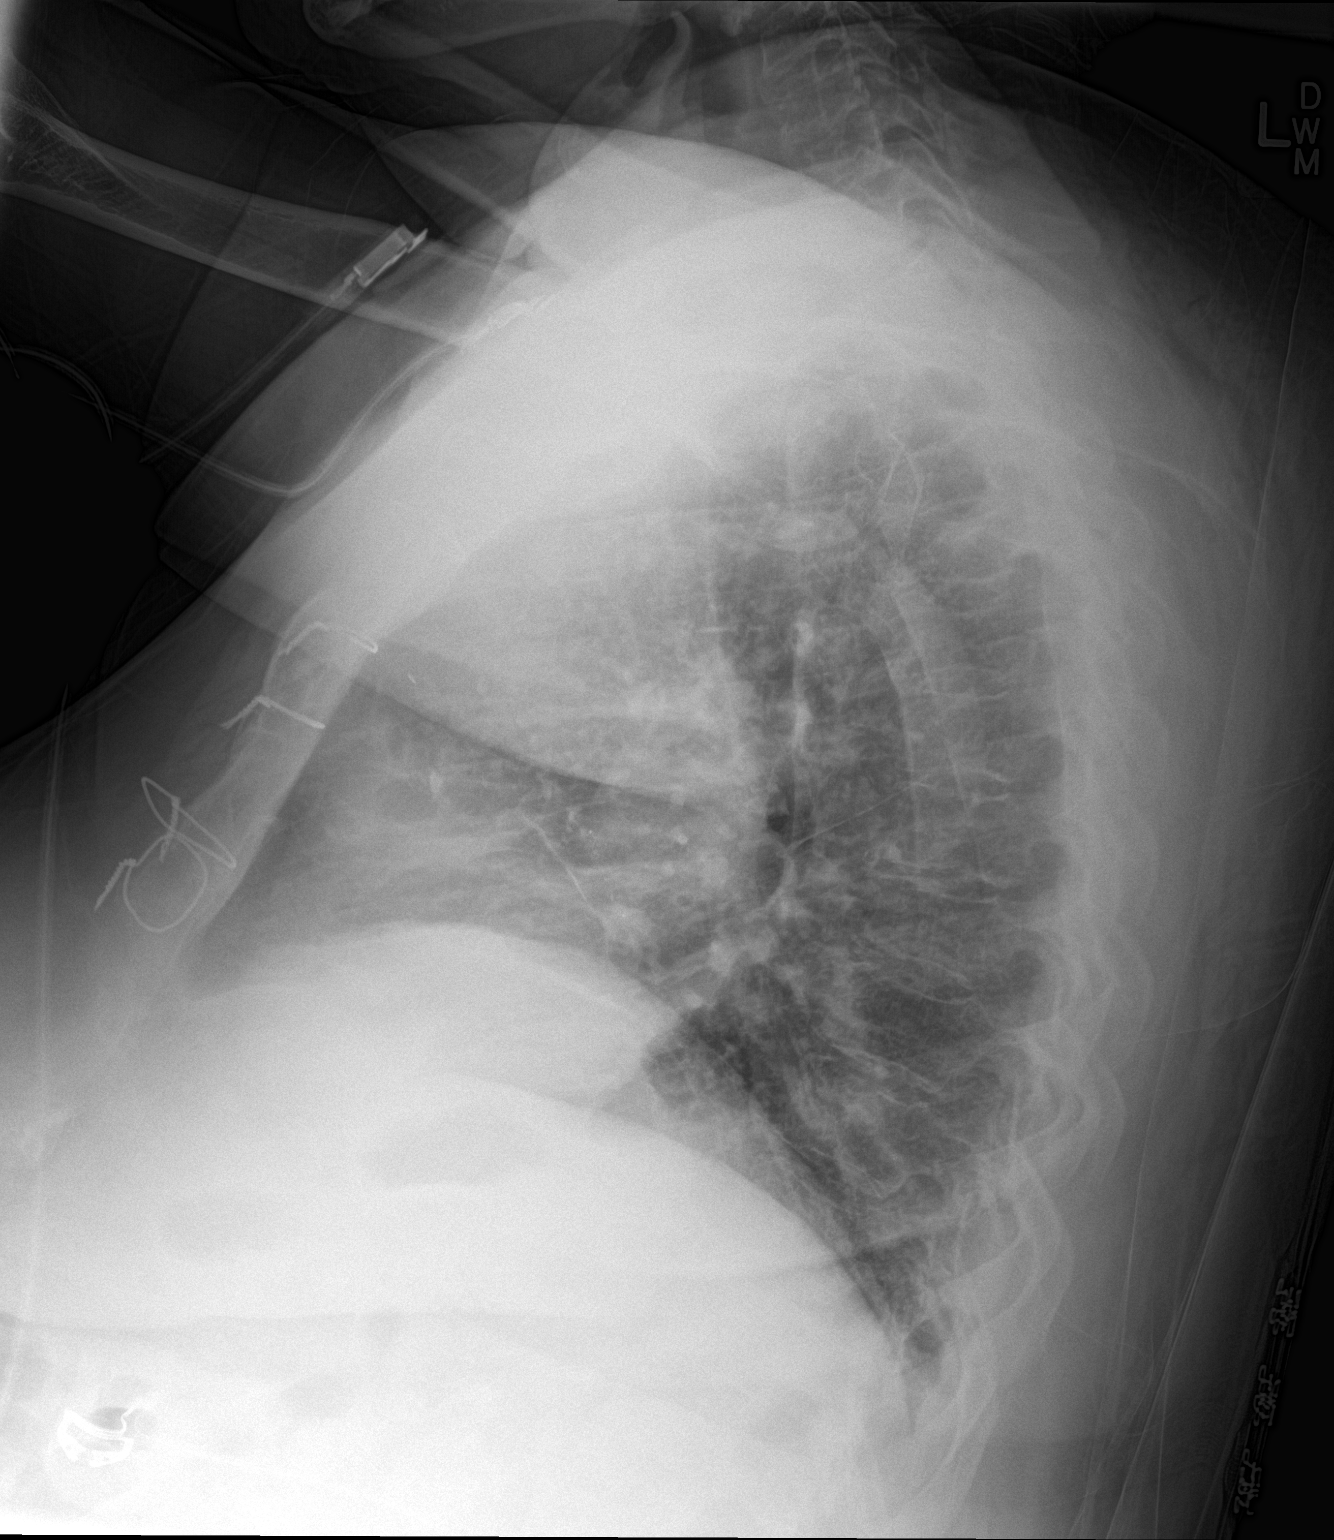

[chest ap]
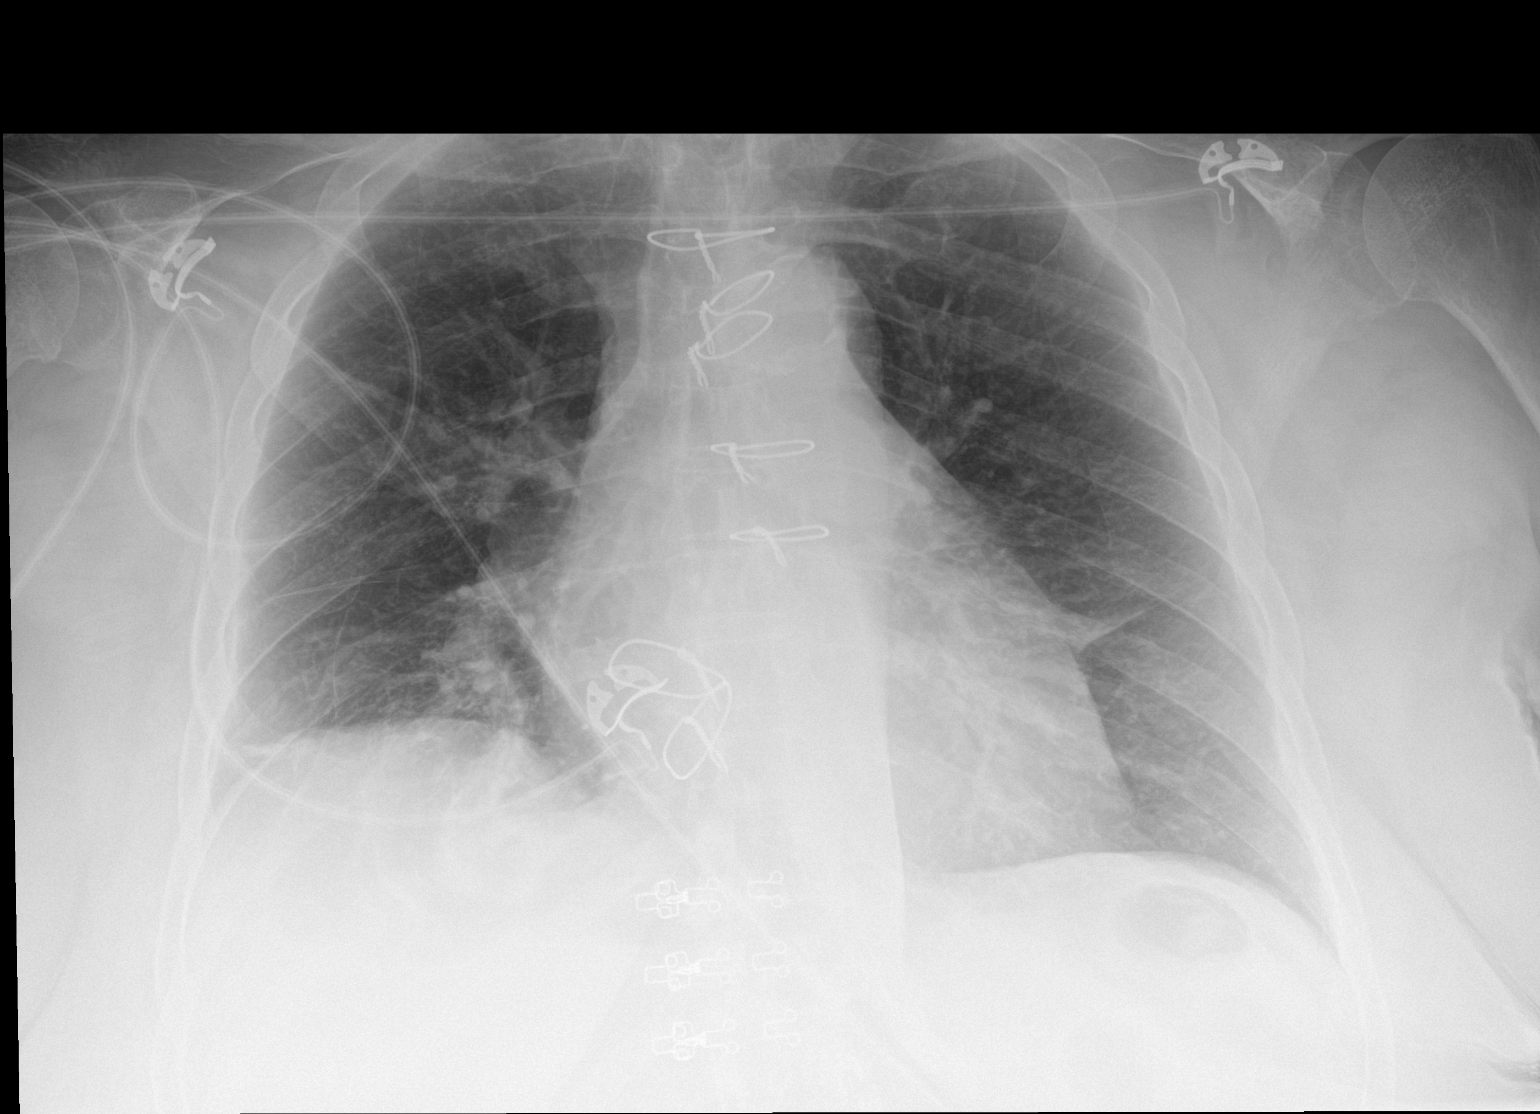

[2 of 2 positions shown; findings below may reference images not displayed]

FINDINGS: The lungs are well-aerated. Mild bilateral atelectasis or scarring
is noted. The lungs are otherwise clear. There is no evidence of
pleural effusion or pneumothorax.

The heart is borderline normal in size. The patient is status post
median sternotomy. No acute osseous abnormalities are seen.
IMPRESSION: Mild bilateral atelectasis or scarring noted. Lungs otherwise clear.

## 2017-01-19 ENCOUNTER — Other Ambulatory Visit (HOSPITAL_COMMUNITY): Payer: Self-pay

## 2017-01-19 NOTE — Progress Notes (Signed)
Medication Samples have been provided to the patient.  Drug name: Ellin GoodieXerelto       Strength: 15 mg        Qty: 2  LOT: [id number]  Exp.Date: 2/21  Dosing instructions: Take 1 tab with supper daily  The patient has been instructed regarding the correct time, dose, and frequency of taking this medication, including desired effects and most common side effects.   Teresa CoombsKatrina Erleen Egner 12:13 PM 01/19/2017

## 2018-07-16 ENCOUNTER — Other Ambulatory Visit: Payer: Self-pay

## 2018-07-16 NOTE — Patient Outreach (Deleted)
[facility]) Care Management  07/16/2018  Christy Swanson [birth year] [id number]   Transportation arranged via [place] for appointment with Dr. Quay Burow on 07/17/18 @ 1:45 PM.   Ronn Melena, [place] Worker [phone]

## 2018-07-17 ENCOUNTER — Other Ambulatory Visit: Payer: Self-pay

## 2018-07-17 NOTE — Patient Outreach (Signed)
Opened In error

## 2022-03-18 DEATH — deceased
